# Patient Record
Sex: Male | Born: 1980 | Hispanic: Yes | Marital: Married | Smoking: Never smoker
Health system: Southern US, Community
[De-identification: ages and names within clinical notes are randomized; demographics above are authoritative.]

## PROBLEM LIST (undated history)

## (undated) DIAGNOSIS — Z8547 Personal history of malignant neoplasm of testis: Secondary | ICD-10-CM

## (undated) DIAGNOSIS — Z9221 Personal history of antineoplastic chemotherapy: Secondary | ICD-10-CM

## (undated) DIAGNOSIS — B001 Herpesviral vesicular dermatitis: Secondary | ICD-10-CM

## (undated) DIAGNOSIS — D509 Iron deficiency anemia, unspecified: Secondary | ICD-10-CM

## (undated) DIAGNOSIS — S82891A Other fracture of right lower leg, initial encounter for closed fracture: Secondary | ICD-10-CM

## (undated) HISTORY — PX: CYSTOSCOPY: SUR368

## (undated) HISTORY — DX: Iron deficiency anemia, unspecified: D50.9

---

## 2006-01-08 ENCOUNTER — Emergency Department (HOSPITAL_COMMUNITY): Admission: EM | Admit: 2006-01-08 | Discharge: 2006-01-08 | Payer: Self-pay | Admitting: Emergency Medicine

## 2009-11-13 DIAGNOSIS — Z8547 Personal history of malignant neoplasm of testis: Secondary | ICD-10-CM

## 2009-11-13 DIAGNOSIS — Z9221 Personal history of antineoplastic chemotherapy: Secondary | ICD-10-CM

## 2009-11-13 HISTORY — DX: Personal history of antineoplastic chemotherapy: Z92.21

## 2009-11-13 HISTORY — DX: Personal history of malignant neoplasm of testis: Z85.47

## 2010-03-14 ENCOUNTER — Ambulatory Visit: Payer: Self-pay | Admitting: Oncology

## 2010-03-15 ENCOUNTER — Inpatient Hospital Stay (HOSPITAL_COMMUNITY): Admission: EM | Admit: 2010-03-15 | Discharge: 2010-03-18 | Payer: Self-pay | Admitting: Emergency Medicine

## 2010-03-16 ENCOUNTER — Encounter: Payer: Self-pay | Admitting: Internal Medicine

## 2010-03-16 ENCOUNTER — Encounter (INDEPENDENT_AMBULATORY_CARE_PROVIDER_SITE_OTHER): Payer: Self-pay | Admitting: Internal Medicine

## 2010-03-16 DIAGNOSIS — C6290 Malignant neoplasm of unspecified testis, unspecified whether descended or undescended: Secondary | ICD-10-CM

## 2010-03-16 DIAGNOSIS — R1900 Intra-abdominal and pelvic swelling, mass and lump, unspecified site: Secondary | ICD-10-CM

## 2010-03-16 HISTORY — PX: ORCHIECTOMY: SHX2116

## 2010-03-16 HISTORY — PX: CYSTOSCOPY W/ URETERAL STENT PLACEMENT: SHX1429

## 2010-03-17 LAB — CONVERTED CEMR LAB
HCT: 37.3 %
Hemoglobin: 12.2 g/dL
MCHC: 32.7 g/dL
MCV: 76.3 fL
Platelets: 261 10*3/uL
RBC: 4.9 M/uL
RDW: 17.3 %
WBC: 10 10*3/uL

## 2010-03-18 ENCOUNTER — Ambulatory Visit: Payer: Self-pay | Admitting: Oncology

## 2010-03-18 LAB — CONVERTED CEMR LAB
ALT: 12 units/L
AST: 16 units/L
Albumin: 2.9 g/dL
Alkaline Phosphatase: 60 units/L
BUN: 5 mg/dL
CO2: 28 meq/L
Calcium: 8.4 mg/dL
Chloride: 104 meq/L
Creatinine, Ser: 0.99 mg/dL
Glucose, Bld: 108 mg/dL
Potassium: 3.8 meq/L
Sodium: 136 meq/L
Total Bilirubin: 0.7 mg/dL
Total Protein: 6.7 g/dL

## 2010-03-23 LAB — BASIC METABOLIC PANEL
BUN: 11 mg/dL (ref 6–23)
CO2: 19 mEq/L (ref 19–32)
Chloride: 101 mEq/L (ref 96–112)
Potassium: 4.3 mEq/L (ref 3.5–5.3)

## 2010-04-05 DIAGNOSIS — C6290 Malignant neoplasm of unspecified testis, unspecified whether descended or undescended: Secondary | ICD-10-CM | POA: Insufficient documentation

## 2010-04-05 LAB — CBC WITH DIFFERENTIAL/PLATELET
Basophils Absolute: 0 10*3/uL (ref 0.0–0.1)
Eosinophils Absolute: 0 10*3/uL (ref 0.0–0.5)
HGB: 13.4 g/dL (ref 13.0–17.1)
MCV: 71.6 fL — ABNORMAL LOW (ref 79.3–98.0)
MONO#: 0.1 10*3/uL (ref 0.1–0.9)
MONO%: 3.6 % (ref 0.0–14.0)
NEUT#: 1.4 10*3/uL — ABNORMAL LOW (ref 1.5–6.5)
Platelets: 159 10*3/uL (ref 140–400)
RDW: 16 % — ABNORMAL HIGH (ref 11.0–14.6)
WBC: 2.7 10*3/uL — ABNORMAL LOW (ref 4.0–10.3)
nRBC: 0 % (ref 0–0)

## 2010-04-05 LAB — COMPREHENSIVE METABOLIC PANEL
Albumin: 3.3 g/dL — ABNORMAL LOW (ref 3.5–5.2)
Alkaline Phosphatase: 86 U/L (ref 39–117)
BUN: 18 mg/dL (ref 6–23)
Calcium: 8.2 mg/dL — ABNORMAL LOW (ref 8.4–10.5)
Chloride: 102 mEq/L (ref 96–112)
Creatinine, Ser: 0.76 mg/dL (ref 0.40–1.50)
Glucose, Bld: 98 mg/dL (ref 70–99)
Potassium: 3.3 mEq/L — ABNORMAL LOW (ref 3.5–5.3)

## 2010-04-06 ENCOUNTER — Ambulatory Visit: Payer: Self-pay | Admitting: Nurse Practitioner

## 2010-04-12 LAB — CBC WITH DIFFERENTIAL/PLATELET
BASO%: 0.3 % (ref 0.0–2.0)
Basophils Absolute: 0 10*3/uL (ref 0.0–0.1)
Eosinophils Absolute: 0 10*3/uL (ref 0.0–0.5)
HCT: 36.3 % — ABNORMAL LOW (ref 38.4–49.9)
HGB: 11.6 g/dL — ABNORMAL LOW (ref 13.0–17.1)
LYMPH%: 26.5 % (ref 14.0–49.0)
MCHC: 32 g/dL (ref 32.0–36.0)
MONO#: 1.2 10*3/uL — ABNORMAL HIGH (ref 0.1–0.9)
NEUT%: 57.6 % (ref 39.0–75.0)
Platelets: 168 10*3/uL (ref 140–400)
WBC: 7.7 10*3/uL (ref 4.0–10.3)
lymph#: 2 10*3/uL (ref 0.9–3.3)

## 2010-04-14 LAB — AFP TUMOR MARKER: AFP-Tumor Marker: 303.8 ng/mL — ABNORMAL HIGH (ref 0.0–8.0)

## 2010-04-18 ENCOUNTER — Ambulatory Visit: Payer: Self-pay | Admitting: Oncology

## 2010-04-18 LAB — CBC WITH DIFFERENTIAL/PLATELET
Basophils Absolute: 0 10*3/uL (ref 0.0–0.1)
Eosinophils Absolute: 0 10*3/uL (ref 0.0–0.5)
HCT: 36.8 % — ABNORMAL LOW (ref 38.4–49.9)
HGB: 11.6 g/dL — ABNORMAL LOW (ref 13.0–17.1)
LYMPH%: 16.8 % (ref 14.0–49.0)
MCHC: 31.5 g/dL — ABNORMAL LOW (ref 32.0–36.0)
MONO#: 0.8 10*3/uL (ref 0.1–0.9)
NEUT%: 74.4 % (ref 39.0–75.0)
Platelets: 374 10*3/uL (ref 140–400)
WBC: 9.2 10*3/uL (ref 4.0–10.3)
lymph#: 1.5 10*3/uL (ref 0.9–3.3)

## 2010-04-18 LAB — COMPREHENSIVE METABOLIC PANEL
ALT: 20 U/L (ref 0–53)
BUN: 14 mg/dL (ref 6–23)
CO2: 27 mEq/L (ref 19–32)
Calcium: 8.7 mg/dL (ref 8.4–10.5)
Chloride: 106 mEq/L (ref 96–112)
Creatinine, Ser: 0.78 mg/dL (ref 0.40–1.50)
Glucose, Bld: 110 mg/dL — ABNORMAL HIGH (ref 70–99)
Total Bilirubin: 0.5 mg/dL (ref 0.3–1.2)

## 2010-04-18 LAB — URINALYSIS, MICROSCOPIC - CHCC

## 2010-04-18 LAB — MAGNESIUM: Magnesium: 2.2 mg/dL (ref 1.5–2.5)

## 2010-04-20 LAB — URINE CULTURE

## 2010-04-25 ENCOUNTER — Ambulatory Visit: Admission: RE | Admit: 2010-04-25 | Discharge: 2010-04-25 | Payer: Self-pay | Admitting: Oncology

## 2010-05-03 LAB — BASIC METABOLIC PANEL
CO2: 26 mEq/L (ref 19–32)
Calcium: 8.6 mg/dL (ref 8.4–10.5)
Creatinine, Ser: 0.85 mg/dL (ref 0.40–1.50)
Sodium: 138 mEq/L (ref 135–145)

## 2010-05-03 LAB — CBC WITH DIFFERENTIAL/PLATELET
Basophils Absolute: 0.1 10*3/uL (ref 0.0–0.1)
EOS%: 1.4 % (ref 0.0–7.0)
Eosinophils Absolute: 0.1 10*3/uL (ref 0.0–0.5)
HCT: 31.4 % — ABNORMAL LOW (ref 38.4–49.9)
HGB: 10.1 g/dL — ABNORMAL LOW (ref 13.0–17.1)
MCH: 24.6 pg — ABNORMAL LOW (ref 27.2–33.4)
MCV: 76.4 fL — ABNORMAL LOW (ref 79.3–98.0)
NEUT#: 5.1 10*3/uL (ref 1.5–6.5)
NEUT%: 71.2 % (ref 39.0–75.0)
RDW: 27.2 % — ABNORMAL HIGH (ref 11.0–14.6)
lymph#: 1.5 10*3/uL (ref 0.9–3.3)

## 2010-05-03 LAB — URINALYSIS, MICROSCOPIC - CHCC
Bilirubin (Urine): NEGATIVE
Blood: NEGATIVE
Ketones: NEGATIVE mg/dL
pH: 5 (ref 4.6–8.0)

## 2010-05-03 LAB — MAGNESIUM: Magnesium: 2.3 mg/dL (ref 1.5–2.5)

## 2010-05-05 LAB — AFP TUMOR MARKER: AFP-Tumor Marker: 55.4 ng/mL — ABNORMAL HIGH (ref 0.0–8.0)

## 2010-05-05 LAB — FERRITIN: Ferritin: 314 ng/mL (ref 22–322)

## 2010-05-05 LAB — BETA HCG QUANT (REF LAB): Beta hCG, Tumor Marker: 330.1 m[IU]/mL — ABNORMAL HIGH (ref <5.0)

## 2010-05-10 LAB — CBC WITH DIFFERENTIAL/PLATELET
Basophils Absolute: 0 10*3/uL (ref 0.0–0.1)
EOS%: 0.1 % (ref 0.0–7.0)
HCT: 31.3 % — ABNORMAL LOW (ref 38.4–49.9)
HGB: 10.4 g/dL — ABNORMAL LOW (ref 13.0–17.1)
MCH: 25.6 pg — ABNORMAL LOW (ref 27.2–33.4)
MONO#: 0.9 10*3/uL (ref 0.1–0.9)
NEUT#: 11.3 10*3/uL — ABNORMAL HIGH (ref 1.5–6.5)
NEUT%: 88 % — ABNORMAL HIGH (ref 39.0–75.0)
RDW: 28.9 % — ABNORMAL HIGH (ref 11.0–14.6)
WBC: 12.8 10*3/uL — ABNORMAL HIGH (ref 4.0–10.3)
lymph#: 0.6 10*3/uL — ABNORMAL LOW (ref 0.9–3.3)

## 2010-05-14 ENCOUNTER — Emergency Department (HOSPITAL_COMMUNITY): Admission: EM | Admit: 2010-05-14 | Discharge: 2010-05-14 | Payer: Self-pay | Admitting: Emergency Medicine

## 2010-05-17 LAB — CBC WITH DIFFERENTIAL/PLATELET
BASO%: 0.3 % (ref 0.0–2.0)
EOS%: 0.3 % (ref 0.0–7.0)
HCT: 29.8 % — ABNORMAL LOW (ref 38.4–49.9)
LYMPH%: 16 % (ref 14.0–49.0)
MCH: 24.8 pg — ABNORMAL LOW (ref 27.2–33.4)
MCHC: 32.9 g/dL (ref 32.0–36.0)
MCV: 75.4 fL — ABNORMAL LOW (ref 79.3–98.0)
MONO%: 4.4 % (ref 0.0–14.0)
NEUT%: 79 % — ABNORMAL HIGH (ref 39.0–75.0)
Platelets: 202 10*3/uL (ref 140–400)
lymph#: 1.2 10*3/uL (ref 0.9–3.3)

## 2010-05-20 ENCOUNTER — Ambulatory Visit: Payer: Self-pay | Admitting: Oncology

## 2010-05-24 LAB — CBC WITH DIFFERENTIAL/PLATELET
BASO%: 0.5 % (ref 0.0–2.0)
LYMPH%: 23 % (ref 14.0–49.0)
MCH: 25.5 pg — ABNORMAL LOW (ref 27.2–33.4)
MCHC: 32.5 g/dL (ref 32.0–36.0)
MCV: 78.5 fL — ABNORMAL LOW (ref 79.3–98.0)
MONO%: 13.2 % (ref 0.0–14.0)
Platelets: 95 10*3/uL — ABNORMAL LOW (ref 140–400)
RBC: 4.04 10*6/uL — ABNORMAL LOW (ref 4.20–5.82)
nRBC: 1 % — ABNORMAL HIGH (ref 0–0)

## 2010-05-26 ENCOUNTER — Ambulatory Visit (HOSPITAL_COMMUNITY): Admission: RE | Admit: 2010-05-26 | Discharge: 2010-05-26 | Payer: Self-pay | Admitting: Oncology

## 2010-05-27 LAB — BASIC METABOLIC PANEL
BUN: 9 mg/dL (ref 6–23)
Calcium: 8.8 mg/dL (ref 8.4–10.5)
Creatinine, Ser: 0.83 mg/dL (ref 0.40–1.50)

## 2010-05-27 LAB — AFP TUMOR MARKER: AFP-Tumor Marker: 20.1 ng/mL — ABNORMAL HIGH (ref 0.0–8.0)

## 2010-05-27 LAB — MAGNESIUM: Magnesium: 2.2 mg/dL (ref 1.5–2.5)

## 2010-05-30 LAB — CBC WITH DIFFERENTIAL/PLATELET
BASO%: 0.2 % (ref 0.0–2.0)
EOS%: 0.9 % (ref 0.0–7.0)
MCH: 26.3 pg — ABNORMAL LOW (ref 27.2–33.4)
MCHC: 32.5 g/dL (ref 32.0–36.0)
MCV: 81 fL (ref 79.3–98.0)
MONO%: 7.7 % (ref 0.0–14.0)
NEUT%: 76.7 % — ABNORMAL HIGH (ref 39.0–75.0)
RDW: 27.6 % — ABNORMAL HIGH (ref 11.0–14.6)
lymph#: 1.2 10*3/uL (ref 0.9–3.3)

## 2010-06-01 LAB — BETA HCG QUANT (REF LAB): Beta hCG, Tumor Marker: 72.7 m[IU]/mL — ABNORMAL HIGH (ref <5.0)

## 2010-06-01 LAB — COMPREHENSIVE METABOLIC PANEL
AST: 16 U/L (ref 0–37)
Albumin: 4.2 g/dL (ref 3.5–5.2)
Alkaline Phosphatase: 87 U/L (ref 39–117)
BUN: 13 mg/dL (ref 6–23)
Potassium: 4.1 mEq/L (ref 3.5–5.3)
Sodium: 138 mEq/L (ref 135–145)
Total Bilirubin: 0.3 mg/dL (ref 0.3–1.2)

## 2010-06-14 LAB — CBC WITH DIFFERENTIAL/PLATELET
BASO%: 0.7 % (ref 0.0–2.0)
Basophils Absolute: 0 10*3/uL (ref 0.0–0.1)
EOS%: 1.2 % (ref 0.0–7.0)
MCH: 28.3 pg (ref 27.2–33.4)
MCHC: 33.5 g/dL (ref 32.0–36.0)
MCV: 84.4 fL (ref 79.3–98.0)
MONO%: 2.5 % (ref 0.0–14.0)
RBC: 3.25 10*6/uL — ABNORMAL LOW (ref 4.20–5.82)
RDW: 29.8 % — ABNORMAL HIGH (ref 11.0–14.6)
lymph#: 1 10*3/uL (ref 0.9–3.3)

## 2010-06-14 LAB — BASIC METABOLIC PANEL
BUN: 9 mg/dL (ref 6–23)
Chloride: 107 mEq/L (ref 96–112)
Potassium: 3.8 mEq/L (ref 3.5–5.3)
Sodium: 139 mEq/L (ref 135–145)

## 2010-06-14 LAB — MAGNESIUM: Magnesium: 2 mg/dL (ref 1.5–2.5)

## 2010-06-29 ENCOUNTER — Ambulatory Visit: Payer: Self-pay | Admitting: Oncology

## 2010-06-30 LAB — CBC WITH DIFFERENTIAL/PLATELET
BASO%: 0.5 % (ref 0.0–2.0)
Basophils Absolute: 0 10*3/uL (ref 0.0–0.1)
HCT: 31.5 % — ABNORMAL LOW (ref 38.4–49.9)
HGB: 10.5 g/dL — ABNORMAL LOW (ref 13.0–17.1)
MONO#: 0.7 10*3/uL (ref 0.1–0.9)
NEUT%: 81.6 % — ABNORMAL HIGH (ref 39.0–75.0)
WBC: 7.6 10*3/uL (ref 4.0–10.3)
lymph#: 0.7 10*3/uL — ABNORMAL LOW (ref 0.9–3.3)

## 2010-06-30 LAB — COMPREHENSIVE METABOLIC PANEL
ALT: 21 U/L (ref 0–53)
Albumin: 3.8 g/dL (ref 3.5–5.2)
BUN: 12 mg/dL (ref 6–23)
CO2: 29 mEq/L (ref 19–32)
Calcium: 8.8 mg/dL (ref 8.4–10.5)
Chloride: 106 mEq/L (ref 96–112)
Creatinine, Ser: 0.92 mg/dL (ref 0.40–1.50)

## 2010-06-30 LAB — LACTATE DEHYDROGENASE: LDH: 176 U/L (ref 94–250)

## 2010-07-03 LAB — BETA HCG QUANT (REF LAB): Beta hCG, Tumor Marker: 19.3 m[IU]/mL — ABNORMAL HIGH (ref <5.0)

## 2010-07-21 ENCOUNTER — Ambulatory Visit (HOSPITAL_COMMUNITY): Admission: RE | Admit: 2010-07-21 | Discharge: 2010-07-21 | Payer: Self-pay | Admitting: Oncology

## 2010-07-25 LAB — AFP TUMOR MARKER: AFP-Tumor Marker: 4.9 ng/mL (ref 0.0–8.0)

## 2010-07-25 LAB — BETA HCG QUANT (REF LAB): Beta hCG, Tumor Marker: 9 m[IU]/mL — ABNORMAL HIGH (ref <5.0)

## 2010-07-25 LAB — LACTATE DEHYDROGENASE: LDH: 209 U/L (ref 94–250)

## 2010-08-19 ENCOUNTER — Ambulatory Visit: Payer: Self-pay | Admitting: Oncology

## 2010-08-20 ENCOUNTER — Emergency Department (HOSPITAL_COMMUNITY): Admission: EM | Admit: 2010-08-20 | Discharge: 2010-08-20 | Payer: Self-pay | Admitting: Emergency Medicine

## 2010-08-25 LAB — AFP TUMOR MARKER: AFP-Tumor Marker: 3.5 ng/mL (ref 0.0–8.0)

## 2010-08-25 LAB — BETA HCG QUANT (REF LAB): Beta hCG, Tumor Marker: 4.9 m[IU]/mL (ref <5.0)

## 2010-08-25 LAB — LACTATE DEHYDROGENASE: LDH: 177 U/L (ref 94–250)

## 2010-09-09 ENCOUNTER — Ambulatory Visit (HOSPITAL_COMMUNITY): Admission: RE | Admit: 2010-09-09 | Discharge: 2010-09-09 | Payer: Self-pay | Admitting: Oncology

## 2010-09-09 LAB — COMPREHENSIVE METABOLIC PANEL
ALT: 45 U/L (ref 0–53)
AST: 33 U/L (ref 0–37)
Albumin: 4 g/dL (ref 3.5–5.2)
Alkaline Phosphatase: 90 U/L (ref 39–117)
BUN: 13 mg/dL (ref 6–23)
CO2: 27 mEq/L (ref 19–32)
Calcium: 8.9 mg/dL (ref 8.4–10.5)
Chloride: 109 mEq/L (ref 96–112)
Creatinine, Ser: 0.93 mg/dL (ref 0.40–1.50)
Glucose, Bld: 89 mg/dL (ref 70–99)
Potassium: 3.8 mEq/L (ref 3.5–5.3)
Sodium: 140 mEq/L (ref 135–145)
Total Bilirubin: 0.7 mg/dL (ref 0.3–1.2)
Total Protein: 7.4 g/dL (ref 6.0–8.3)

## 2010-09-09 LAB — LACTATE DEHYDROGENASE: LDH: 183 U/L (ref 94–250)

## 2010-09-12 LAB — AFP TUMOR MARKER: AFP-Tumor Marker: 3.6 ng/mL (ref 0.0–8.0)

## 2010-09-12 LAB — BETA HCG QUANT (REF LAB): Beta hCG, Tumor Marker: 4.1 m[IU]/mL (ref <5.0)

## 2010-11-25 ENCOUNTER — Ambulatory Visit: Payer: Self-pay | Admitting: Oncology

## 2010-11-28 ENCOUNTER — Emergency Department (HOSPITAL_COMMUNITY)
Admission: EM | Admit: 2010-11-28 | Discharge: 2010-11-28 | Payer: Self-pay | Source: Home / Self Care | Admitting: Emergency Medicine

## 2010-11-30 LAB — CBC
HCT: 34.3 % — ABNORMAL LOW (ref 39.0–52.0)
Hemoglobin: 11.1 g/dL — ABNORMAL LOW (ref 13.0–17.0)
MCH: 28.2 pg (ref 26.0–34.0)
MCHC: 32.4 g/dL (ref 30.0–36.0)
MCV: 87.3 fL (ref 78.0–100.0)
Platelets: 280 10*3/uL (ref 150–400)
RBC: 3.93 MIL/uL — ABNORMAL LOW (ref 4.22–5.81)
RDW: 16.7 % — ABNORMAL HIGH (ref 11.5–15.5)
WBC: 6.7 10*3/uL (ref 4.0–10.5)

## 2010-11-30 LAB — DIFFERENTIAL
Basophils Absolute: 0 10*3/uL (ref 0.0–0.1)
Basophils Relative: 0 % (ref 0–1)
Eosinophils Absolute: 0.1 10*3/uL (ref 0.0–0.7)
Eosinophils Relative: 1 % (ref 0–5)
Lymphocytes Relative: 10 % — ABNORMAL LOW (ref 12–46)
Lymphs Abs: 0.7 10*3/uL (ref 0.7–4.0)
Monocytes Absolute: 0.5 10*3/uL (ref 0.1–1.0)
Monocytes Relative: 7 % (ref 3–12)
Neutro Abs: 5.5 10*3/uL (ref 1.7–7.7)
Neutrophils Relative %: 82 % — ABNORMAL HIGH (ref 43–77)

## 2010-12-13 NOTE — Assessment & Plan Note (Signed)
Summary: NEW - Establish Care   Vital Signs:  Patient profile:   30 year old male Height:      68 inches Weight:      193.8 pounds BMI:     29.57 BSA:     2.02 Pulse rate:   83 / minute Pulse rhythm:   regular Resp:     20 per minute BP sitting:   103 / 66  (left arm) Cuff size:   large  Vitals Entered By: Levon Hedger (Apr 06, 2010 11:06 AM) CC: follow-up visit [facility] Is Patient Diabetic? No Pain Assessment Patient in pain? no       Does patient need assistance? Functional Status Self care Ambulation Normal   CC:  follow-up visit [facility].  History of Present Illness:  Pt into the office to establish care. No previous PCP  Presents today with wife who is pregnant with couples 3rd child and is due on June 3rd, 2011  PMH - none PSH - none  Hospitalization from 03/16/2010 to 03/18/2010 (full hospital d/c reviewed) pt presented to the ER with back pain and was dx with testicular cancer  1.  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 (Dr. Truett Perna)  2.  Abdominal mass - noted while in the hospital Urology consulted Cystoscopy - right retrograde pyelogram with interpretion, right double J stent placement Pt has a f/u appt with Dr. Patsi Sears on June 1st  Social - Employed at [employer] and has currently taken a leave a absence due to recent illness and ongoing treatment  Habits & Providers  Alcohol-Tobacco-Diet     Alcohol drinks/day: <1     Tobacco Status: never  Exercise-Depression-Behavior     Have you felt down or hopeless? no     Have you felt little pleasure in things? no     Depression Counseling: not indicated; screening negative for depression     Drug Use: never  Allergies (verified): No Known Drug Allergies  Past History:  Past  Surgical History: right radical inguinal orchiectomy 03/2010 cystoscopy, right retrograde pyelogram with interpretation, right double- J stent placement 03/2010  Family History: father - diabetes sister- diabetes brother - diabetes  Social History: married 3 children tobacco - none ETOH - none drug - noneSmoking Status:  never Drug Use:  never  Review of Systems General:  Denies fever. CV:  Denies chest pain or discomfort. Resp:  Denies cough. GI:  Complains of abdominal pain and nausea; denies constipation and vomiting.  Physical Exam  General:  alert.   Head:  normocephalic.   Lungs:  normal breath sounds.   Heart:  normal rate and regular rhythm.   Abdomen:  right lower quad tenderness with deep palpation Msk:  normal ROM.   Neurologic:  alert & oriented X3.     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 One capsule by  mouth daily 2)  Uribel 118 Mg Caps (Meth-hyo-m bl-na phos-ph sal) .... One capsule by mouth three times a day 3)  Tylenol 325 Mg Tabs (Acetaminophen) .... 2 tablets by mouth every 4 hours as needed 4)  Percocet 5-325 Mg Tabs (Oxycodone-acetaminophen) .... One tblet by mouth three times a day as needed for pain  Patient Instructions: 1)  Schedule an eligibility appointment to get an orange card. 2)  Please let the cancer center know that you are coming to this office so they can sent the notes here 3)  Follow up as needed   X-ray  Procedure date:   03/18/2010  Findings:      abdominal x-ray - no demonstrated acute abdominal findings. bowel gass pattern is normal.  mild perihilar right lung atelectsis  CT of Abdomen  Procedure date:  03/18/2010  Findings:      large hypervascular right retroperitoneal mass which compresses the IVC and causes obstruction of the proximal right ureter and moderate hydronephrosis.    MISC. Report  Procedure date:  03/16/2010  Findings:      ultrasound of scrotum - 3.5cm right testicular mass compatible with testicular neoplasm. Left testicular microlithiasis  CT of Chest  Procedure date:  03/16/2010  Findings:      scattered tiny bilateral pulmonary parenchymal nodules. imaging features are nonspecific and while this may be related to noncalcified granulomatous scarring, pulmonary metastatic involvement certainly has this appearance. A 1 cm subtle lesion in the posterior right liver.  This is not visible on the previous study presumable secondary to slight difference in bolus time. Metastatic involvementof the liver is a concern  CT Brain  Procedure date:  03/16/2010  Findings:      no acute intracranial findings. no evidence of metastatic involvement   X-ray  Procedure date:  03/18/2010  Findings:      abdominal x-ray - no demonstrated acute abdominal findings. bowel gass pattern is normal.  mild perihilar right lung atelectsis  CT of Abdomen  Procedure date:  03/18/2010  Findings:      large hypervascular right retroperitoneal mass which compresses the IVC and causes obstruction of the proximal right ureter and moderate hydronephrosis.    MISC. Report  Procedure date:  03/16/2010  Findings:      ultrasound of scrotum - 3.5cm right testicular mass compatible with testicular neoplasm. Left testicular microlithiasis  CT of Chest  Procedure date:  03/16/2010  Findings:      scattered tiny bilateral pulmonary parenchymal nodules. imaging features are nonspecific and while  this may be related to noncalcified granulomatous scarring, pulmonary metastatic involvement certainly has this appearance. A 1 cm subtle lesion in the posterior right liver.  This is not visible on the previous study presumable secondary to slight difference in bolus time. Metastatic involvementof the liver is a concern  CT Brain  Procedure date:  03/16/2010  Findings:      no acute intracranial findings. no evidence of metastatic involvement

## 2010-12-13 NOTE — Letter (Signed)
Summary: PT INFORMATION SHEET  PT INFORMATION SHEET   Imported By: Arta Bruce 05/23/2010 11:13:12  _____________________________________________________________________  External Attachment:    Type:   Image     Comment:   External Document

## 2010-12-28 ENCOUNTER — Other Ambulatory Visit: Payer: Self-pay | Admitting: Oncology

## 2010-12-28 ENCOUNTER — Encounter (HOSPITAL_BASED_OUTPATIENT_CLINIC_OR_DEPARTMENT_OTHER): Payer: Self-pay | Admitting: Oncology

## 2010-12-28 DIAGNOSIS — C6290 Malignant neoplasm of unspecified testis, unspecified whether descended or undescended: Secondary | ICD-10-CM

## 2010-12-30 LAB — COMPREHENSIVE METABOLIC PANEL
Alkaline Phosphatase: 111 U/L (ref 39–117)
CO2: 24 mEq/L (ref 19–32)
Creatinine, Ser: 0.88 mg/dL (ref 0.40–1.50)
Glucose, Bld: 99 mg/dL (ref 70–99)
Sodium: 139 mEq/L (ref 135–145)
Total Bilirubin: 0.6 mg/dL (ref 0.3–1.2)
Total Protein: 7.5 g/dL (ref 6.0–8.3)

## 2010-12-30 LAB — AFP TUMOR MARKER: AFP-Tumor Marker: 3.5 ng/mL (ref 0.0–8.0)

## 2010-12-30 LAB — BETA HCG QUANT (REF LAB): Beta hCG, Tumor Marker: <0.5 m[IU]/mL (ref <5.0)

## 2011-01-31 LAB — URINALYSIS, ROUTINE W REFLEX MICROSCOPIC
Glucose, UA: NEGATIVE mg/dL
Ketones, ur: NEGATIVE mg/dL
Nitrite: NEGATIVE
Specific Gravity, Urine: 1.018 (ref 1.005–1.030)
pH: 7.5 (ref 5.0–8.0)

## 2011-01-31 LAB — CBC
HCT: 41.9 % (ref 39.0–52.0)
HCT: 37.3 % — ABNORMAL LOW (ref 39.0–52.0)
Hemoglobin: 13.8 g/dL (ref 13.0–17.0)
MCHC: 32.7 g/dL (ref 30.0–36.0)
MCV: 76.3 fL — ABNORMAL LOW (ref 78.0–100.0)
Platelets: 261 10*3/uL (ref 150–400)
RBC: 5.51 MIL/uL (ref 4.22–5.81)
RDW: 16.5 % — ABNORMAL HIGH (ref 11.5–15.5)
RDW: 17.3 % — ABNORMAL HIGH (ref 11.5–15.5)
WBC: 7.9 10*3/uL (ref 4.0–10.5)

## 2011-01-31 LAB — COMPREHENSIVE METABOLIC PANEL
ALT: 14 U/L (ref 0–53)
Albumin: 2.9 g/dL — ABNORMAL LOW (ref 3.5–5.2)
Alkaline Phosphatase: 75 U/L (ref 39–117)
BUN: 13 mg/dL (ref 6–23)
BUN: 5 mg/dL — ABNORMAL LOW (ref 6–23)
Calcium: 8.4 mg/dL (ref 8.4–10.5)
Chloride: 104 mEq/L (ref 96–112)
Chloride: 104 mEq/L (ref 96–112)
Creatinine, Ser: 0.99 mg/dL (ref 0.4–1.5)
Glucose, Bld: 84 mg/dL (ref 70–99)
Potassium: 3.5 mEq/L (ref 3.5–5.1)
Sodium: 134 mEq/L — ABNORMAL LOW (ref 135–145)
Total Bilirubin: 0.8 mg/dL (ref 0.3–1.2)
Total Bilirubin: 0.7 mg/dL (ref 0.3–1.2)
Total Protein: 7 g/dL (ref 6.0–8.3)
Total Protein: 6.7 g/dL (ref 6.0–8.3)

## 2011-01-31 LAB — DIFFERENTIAL
Basophils Absolute: 0 10*3/uL (ref 0.0–0.1)
Lymphocytes Relative: 16 % (ref 12–46)
Lymphs Abs: 1.2 10*3/uL (ref 0.7–4.0)
Neutro Abs: 5.6 10*3/uL (ref 1.7–7.7)

## 2011-01-31 LAB — BASIC METABOLIC PANEL
BUN: 14 mg/dL (ref 6–23)
CO2: 26 mEq/L (ref 19–32)
Chloride: 104 mEq/L (ref 96–112)
Creatinine, Ser: 1.4 mg/dL (ref 0.4–1.5)
Glucose, Bld: 103 mg/dL — ABNORMAL HIGH (ref 70–99)
Potassium: 3.7 mEq/L (ref 3.5–5.1)

## 2011-01-31 LAB — BETA HCG QUANT (REF LAB): Beta hCG, Tumor Marker: 91732 m[IU]/mL — ABNORMAL HIGH (ref <5.0)

## 2011-01-31 LAB — FERRITIN: Ferritin: 111 ng/mL (ref 22–322)

## 2011-01-31 LAB — LIPASE, BLOOD: Lipase: 20 U/L (ref 11–59)

## 2011-01-31 LAB — LACTATE DEHYDROGENASE: LDH: 740 U/L — ABNORMAL HIGH (ref 94–250)

## 2011-02-09 ENCOUNTER — Other Ambulatory Visit: Payer: Self-pay | Admitting: Oncology

## 2011-02-09 ENCOUNTER — Encounter (HOSPITAL_BASED_OUTPATIENT_CLINIC_OR_DEPARTMENT_OTHER): Payer: Self-pay | Admitting: Oncology

## 2011-02-09 DIAGNOSIS — C6290 Malignant neoplasm of unspecified testis, unspecified whether descended or undescended: Secondary | ICD-10-CM

## 2011-02-12 LAB — COMPREHENSIVE METABOLIC PANEL
ALT: 14 U/L (ref 0–53)
Alkaline Phosphatase: 99 U/L (ref 39–117)
CO2: 24 mEq/L (ref 19–32)
Potassium: 3.7 mEq/L (ref 3.5–5.3)
Sodium: 139 mEq/L (ref 135–145)
Total Bilirubin: 0.8 mg/dL (ref 0.3–1.2)
Total Protein: 7 g/dL (ref 6.0–8.3)

## 2011-02-12 LAB — AFP TUMOR MARKER: AFP-Tumor Marker: 1.8 ng/mL (ref 0.0–8.0)

## 2011-05-10 ENCOUNTER — Ambulatory Visit (HOSPITAL_COMMUNITY)
Admission: RE | Admit: 2011-05-10 | Discharge: 2011-05-10 | Disposition: A | Discharge status: Home / Self Care | Payer: Self-pay | Source: Ambulatory Visit | Attending: Oncology | Admitting: Oncology

## 2011-05-10 ENCOUNTER — Other Ambulatory Visit: Payer: Self-pay | Admitting: Oncology

## 2011-05-10 DIAGNOSIS — Z902 Acquired absence of lung [part of]: Secondary | ICD-10-CM | POA: Insufficient documentation

## 2011-05-10 DIAGNOSIS — C6290 Malignant neoplasm of unspecified testis, unspecified whether descended or undescended: Secondary | ICD-10-CM

## 2011-05-11 ENCOUNTER — Other Ambulatory Visit: Payer: Self-pay | Admitting: Oncology

## 2011-05-11 ENCOUNTER — Encounter (HOSPITAL_BASED_OUTPATIENT_CLINIC_OR_DEPARTMENT_OTHER): Payer: Self-pay | Admitting: Oncology

## 2011-05-11 DIAGNOSIS — C6290 Malignant neoplasm of unspecified testis, unspecified whether descended or undescended: Secondary | ICD-10-CM

## 2011-05-11 LAB — CBC WITH DIFFERENTIAL/PLATELET
Basophils Absolute: 0 10*3/uL (ref 0.0–0.1)
EOS%: 1.1 % (ref 0.0–7.0)
HCT: 36.5 % — ABNORMAL LOW (ref 38.4–49.9)
HGB: 11.6 g/dL — ABNORMAL LOW (ref 13.0–17.1)
MCH: 21.3 pg — ABNORMAL LOW (ref 27.2–33.4)
MCV: 67.1 fL — ABNORMAL LOW (ref 79.3–98.0)
MONO%: 9.6 % (ref 0.0–14.0)
NEUT%: 69.1 % (ref 39.0–75.0)

## 2011-05-13 LAB — COMPREHENSIVE METABOLIC PANEL
Alkaline Phosphatase: 108 U/L (ref 39–117)
BUN: 20 mg/dL (ref 6–23)
Glucose, Bld: 139 mg/dL — ABNORMAL HIGH (ref 70–99)
Total Bilirubin: 0.6 mg/dL (ref 0.3–1.2)

## 2011-05-13 LAB — BETA HCG QUANT (REF LAB): Beta hCG, Tumor Marker: 0.5 m[IU]/mL (ref <5.0)

## 2011-05-13 LAB — AFP TUMOR MARKER: AFP-Tumor Marker: 2.4 ng/mL (ref 0.0–8.0)

## 2011-05-23 ENCOUNTER — Other Ambulatory Visit: Payer: Self-pay | Admitting: Oncology

## 2011-05-23 ENCOUNTER — Encounter (HOSPITAL_BASED_OUTPATIENT_CLINIC_OR_DEPARTMENT_OTHER): Payer: Self-pay | Admitting: Oncology

## 2011-05-23 DIAGNOSIS — C6290 Malignant neoplasm of unspecified testis, unspecified whether descended or undescended: Secondary | ICD-10-CM

## 2011-05-23 LAB — URINALYSIS, MICROSCOPIC - CHCC
Blood: NEGATIVE
Leukocyte Esterase: NEGATIVE
Nitrite: NEGATIVE
pH: 6 (ref 4.6–8.0)

## 2011-05-24 ENCOUNTER — Encounter: Payer: Self-pay | Admitting: Gastroenterology

## 2011-05-25 ENCOUNTER — Encounter (HOSPITAL_BASED_OUTPATIENT_CLINIC_OR_DEPARTMENT_OTHER): Payer: Self-pay | Admitting: Oncology

## 2011-06-06 ENCOUNTER — Other Ambulatory Visit: Payer: Self-pay | Admitting: Oncology

## 2011-06-06 DIAGNOSIS — R1909 Other intra-abdominal and pelvic swelling, mass and lump: Secondary | ICD-10-CM

## 2011-07-04 ENCOUNTER — Ambulatory Visit (INDEPENDENT_AMBULATORY_CARE_PROVIDER_SITE_OTHER): Payer: Self-pay | Admitting: Gastroenterology

## 2011-07-04 ENCOUNTER — Encounter: Payer: Self-pay | Admitting: Gastroenterology

## 2011-07-04 VITALS — BP 100/68 | HR 60 | Ht 67.0 in | Wt 212.0 lb

## 2011-07-04 DIAGNOSIS — C6290 Malignant neoplasm of unspecified testis, unspecified whether descended or undescended: Secondary | ICD-10-CM | POA: Insufficient documentation

## 2011-07-04 DIAGNOSIS — D509 Iron deficiency anemia, unspecified: Secondary | ICD-10-CM

## 2011-07-04 DIAGNOSIS — Z809 Family history of malignant neoplasm, unspecified: Secondary | ICD-10-CM

## 2011-07-04 MED ORDER — PEG-KCL-NACL-NASULF-NA ASC-C 100 G PO SOLR: 1.0000 | ORAL | Status: DC

## 2011-07-04 NOTE — Patient Instructions (Signed)
You will be set up for a colonoscopy.  

## 2011-07-04 NOTE — Progress Notes (Signed)
  HPI: This is a  very pleasant 30 year old man. He speaks Spanish predominantly but we can get by with his English is fairly good.  Testicular cancer diagnosed may 2011: treated with orchiectomy, abdominal surgeries ([facility] at [facility]).  No XRT.  +chemo.  Last chemo was November 2011.     Recent blood work shows hemoglobin of 11.6, MCV was in the 60s. Ferritin is quite low. Fecal occult blood testing x3 was negative. He has no overt bleeding.    Father may have had colon cancer (pt unclear).     Review of systems: Pertinent positive and negative review of systems were noted in the above HPI section.  All other review of systems was otherwise negative.   Past Medical History  Diagnosis Date  . Anemia   . Testicular cancer     Past Surgical History  Procedure Date  . Orciectomy     right  . Cystoscopy      reports that he has never smoked. He has never used smokeless tobacco. He reports that he does not drink alcohol or use illicit drugs.  family history includes Diabetes in his brother, father, and sister.    Current Medications, Allergies were all reviewed with the patient via [facility] electronic medical record system.    Physical Exam: BP 100/68  Pulse 60  Ht 5\' 7"  (1.702 m)  Wt 212 lb (96.163 kg)  BMI 33.20 kg/m2 Constitutional: generally well-appearing Psychiatric: alert and oriented x3 Eyes: extraocular movements intact Mouth: oral pharynx moist, no lesions Neck: supple no lymphadenopathy Cardiovascular: heart regular rate and rhythm Lungs: clear to auscultation bilaterally Abdomen: soft, nontender, nondistended, no obvious ascites, no peritoneal signs, normal bowel sounds Extremities: no lower extremity edema bilaterally Skin: no lesions on visible extremities    Assessment and plan: 30 y.o. male with iron def, microcytic anemia, family history of colon cancer  We will arrange for colonosocpy at his soonest convenience for anemia, fh of  colon cancer

## 2011-07-10 ENCOUNTER — Other Ambulatory Visit: Payer: Self-pay | Admitting: Oncology

## 2011-07-10 ENCOUNTER — Encounter (HOSPITAL_BASED_OUTPATIENT_CLINIC_OR_DEPARTMENT_OTHER): Payer: Self-pay | Admitting: Oncology

## 2011-07-10 DIAGNOSIS — C6290 Malignant neoplasm of unspecified testis, unspecified whether descended or undescended: Secondary | ICD-10-CM

## 2011-07-10 LAB — CBC WITH DIFFERENTIAL/PLATELET
Basophils Absolute: 0 10*3/uL (ref 0.0–0.1)
Eosinophils Absolute: 0.1 10*3/uL (ref 0.0–0.5)
HGB: 11.7 g/dL — ABNORMAL LOW (ref 13.0–17.1)
NEUT#: 3.8 10*3/uL (ref 1.5–6.5)
RBC: 5.54 10*6/uL (ref 4.20–5.82)
RDW: 18.6 % — ABNORMAL HIGH (ref 11.0–14.6)
WBC: 5.3 10*3/uL (ref 4.0–10.3)
lymph#: 1.1 10*3/uL (ref 0.9–3.3)

## 2011-07-10 LAB — FERRITIN: Ferritin: 6 ng/mL — ABNORMAL LOW (ref 22–322)

## 2011-07-14 ENCOUNTER — Other Ambulatory Visit: Payer: Self-pay | Admitting: Oncology

## 2011-07-14 DIAGNOSIS — C6290 Malignant neoplasm of unspecified testis, unspecified whether descended or undescended: Secondary | ICD-10-CM

## 2011-07-19 ENCOUNTER — Ambulatory Visit (AMBULATORY_SURGERY_CENTER): Payer: Self-pay | Admitting: Gastroenterology

## 2011-07-19 ENCOUNTER — Other Ambulatory Visit: Payer: Self-pay | Admitting: Gastroenterology

## 2011-07-19 ENCOUNTER — Encounter: Payer: Self-pay | Admitting: Gastroenterology

## 2011-07-19 DIAGNOSIS — D509 Iron deficiency anemia, unspecified: Secondary | ICD-10-CM

## 2011-07-19 DIAGNOSIS — D649 Anemia, unspecified: Secondary | ICD-10-CM

## 2011-07-19 DIAGNOSIS — Z809 Family history of malignant neoplasm, unspecified: Secondary | ICD-10-CM

## 2011-07-19 MED ORDER — SODIUM CHLORIDE 0.9 % IV SOLN: 500.0000 mL | INTRAVENOUS | Status: DC

## 2011-07-19 NOTE — Progress Notes (Signed)
Pt assisted to the rest room with his wife at his side.  No complaints on discharge.  Pt speeks spanish and Vanice Sarah , interpretor from [employer] went over all instructions with the pt and his wife.  No questions noted.

## 2011-07-19 NOTE — Patient Instructions (Signed)
See the picture page for your findings from your exam today.  Follow the green and blue discharge instruction sheets the rest of the day.  Resume your prior medications today. Dr. Christella Hartigan wants you to complete 2 sets of [facility] card at home over the next couple of weeks.  You can either bring them to the 3rd floor or mail them back to [place].  Please call if questions or concerns.

## 2011-07-20 ENCOUNTER — Telehealth: Payer: Self-pay

## 2011-07-20 NOTE — Telephone Encounter (Signed)

## 2011-08-06 IMAGING — CR DG ABDOMEN ACUTE W/ 1V CHEST
3 series · 3 of 3 positions shown · non-contrast
Comparison: None.

CLINICAL DATA: Right mid to low abdominal pain for several weeks.

ACUTE ABDOMEN SERIES (ABDOMEN 2 VIEW & CHEST 1 VIEW)

[w chest pa]
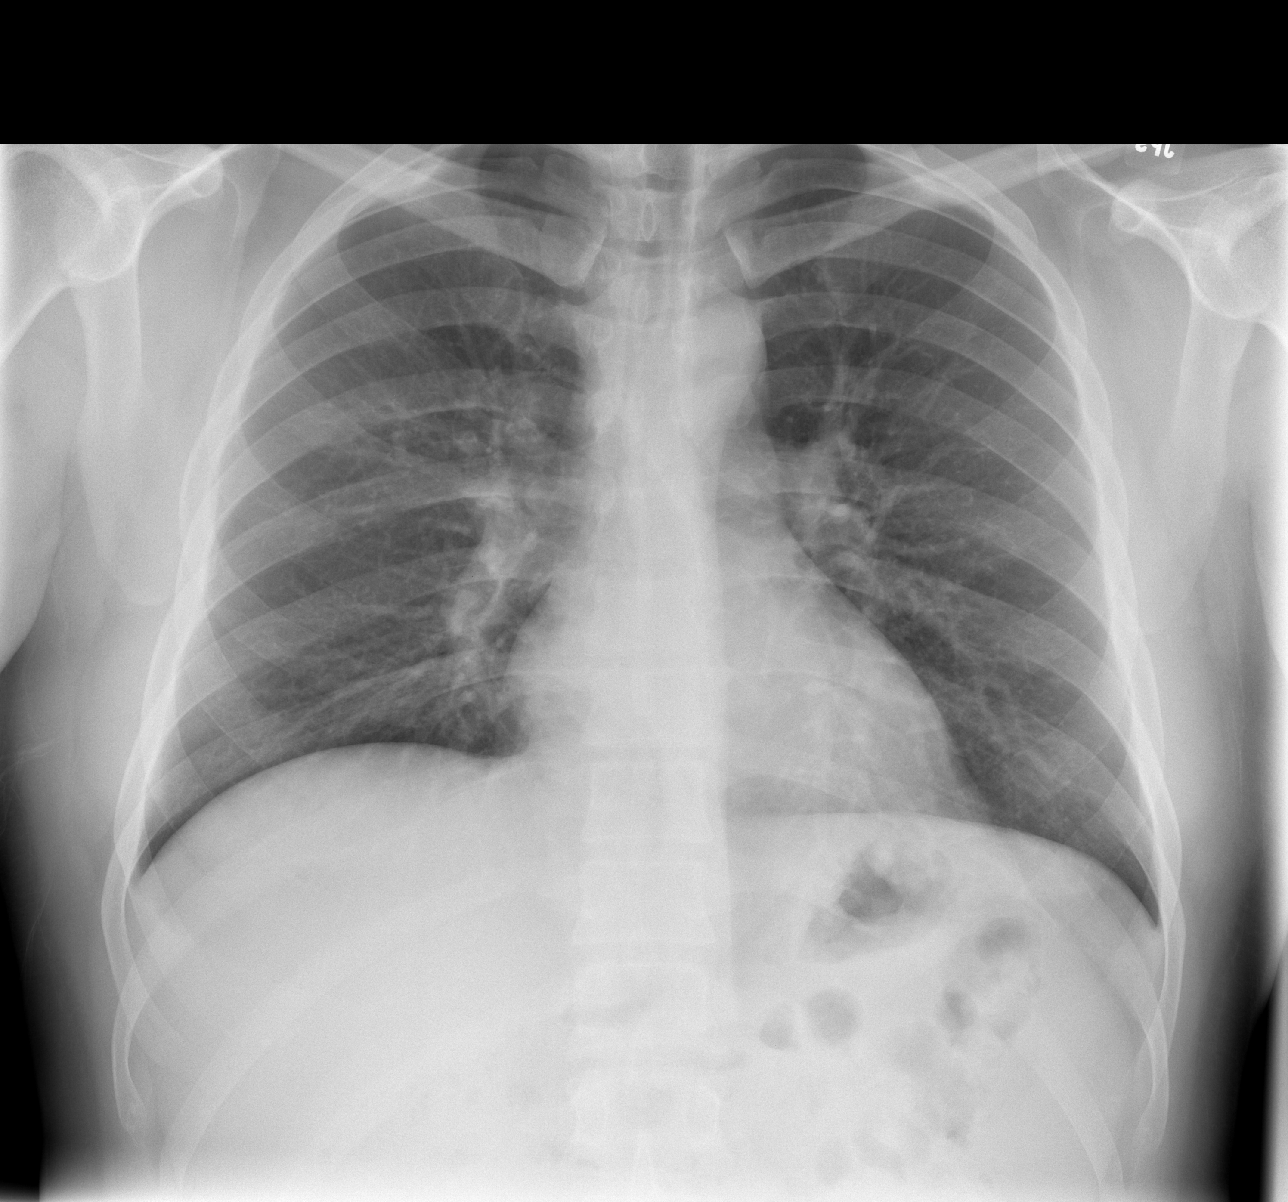

[w abdomen upright]
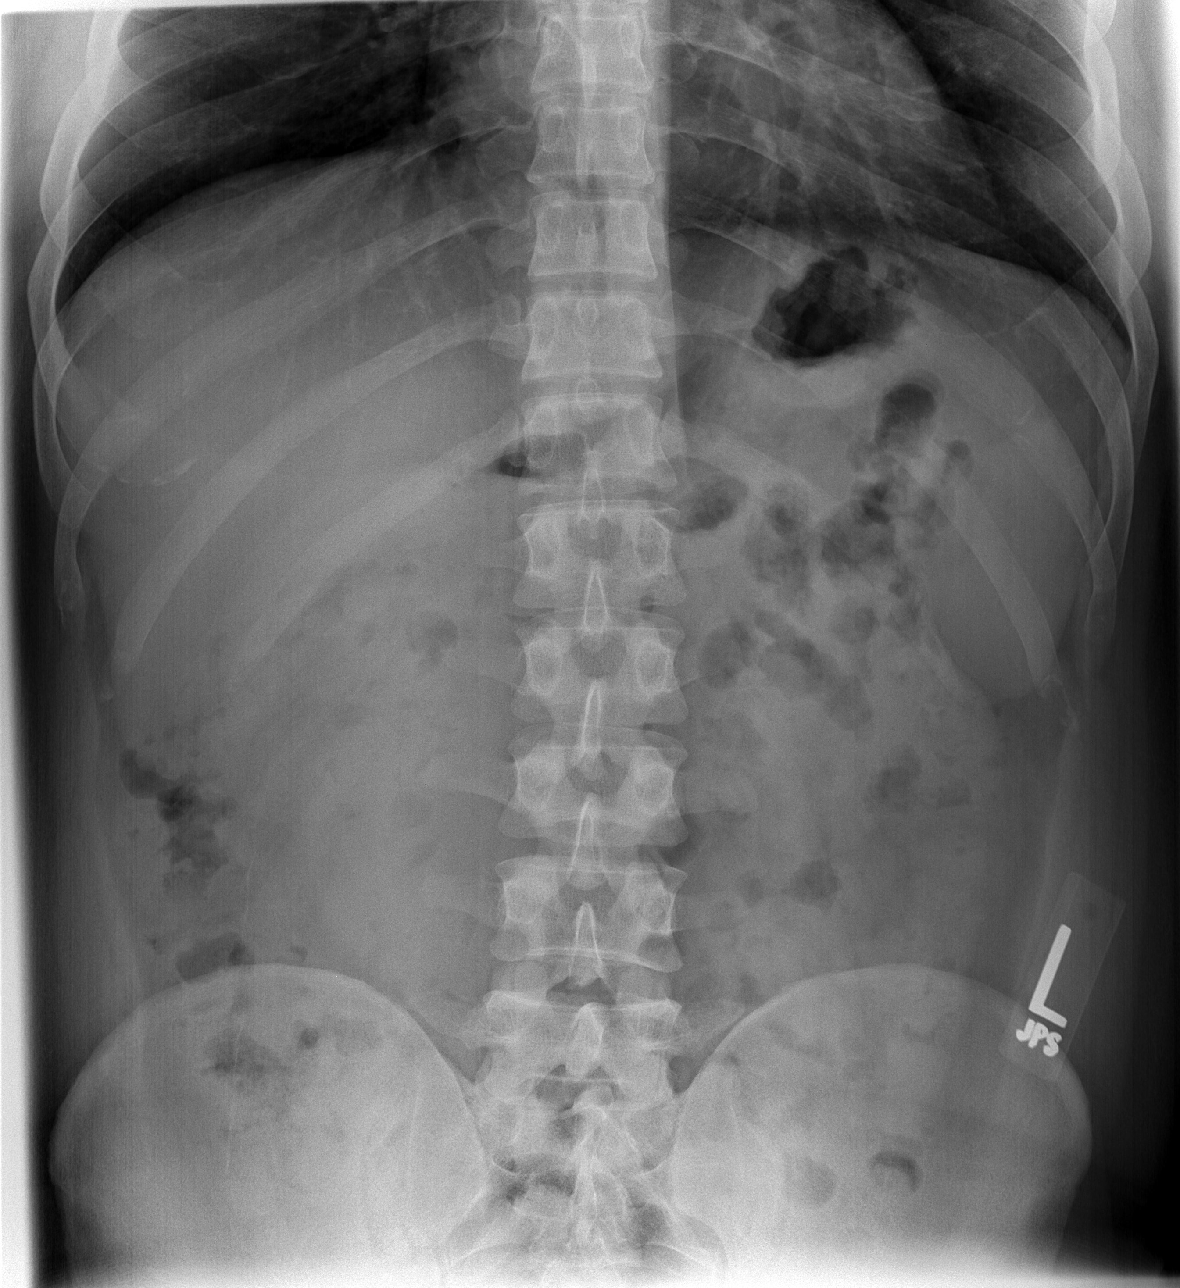

[t abdomen supine]
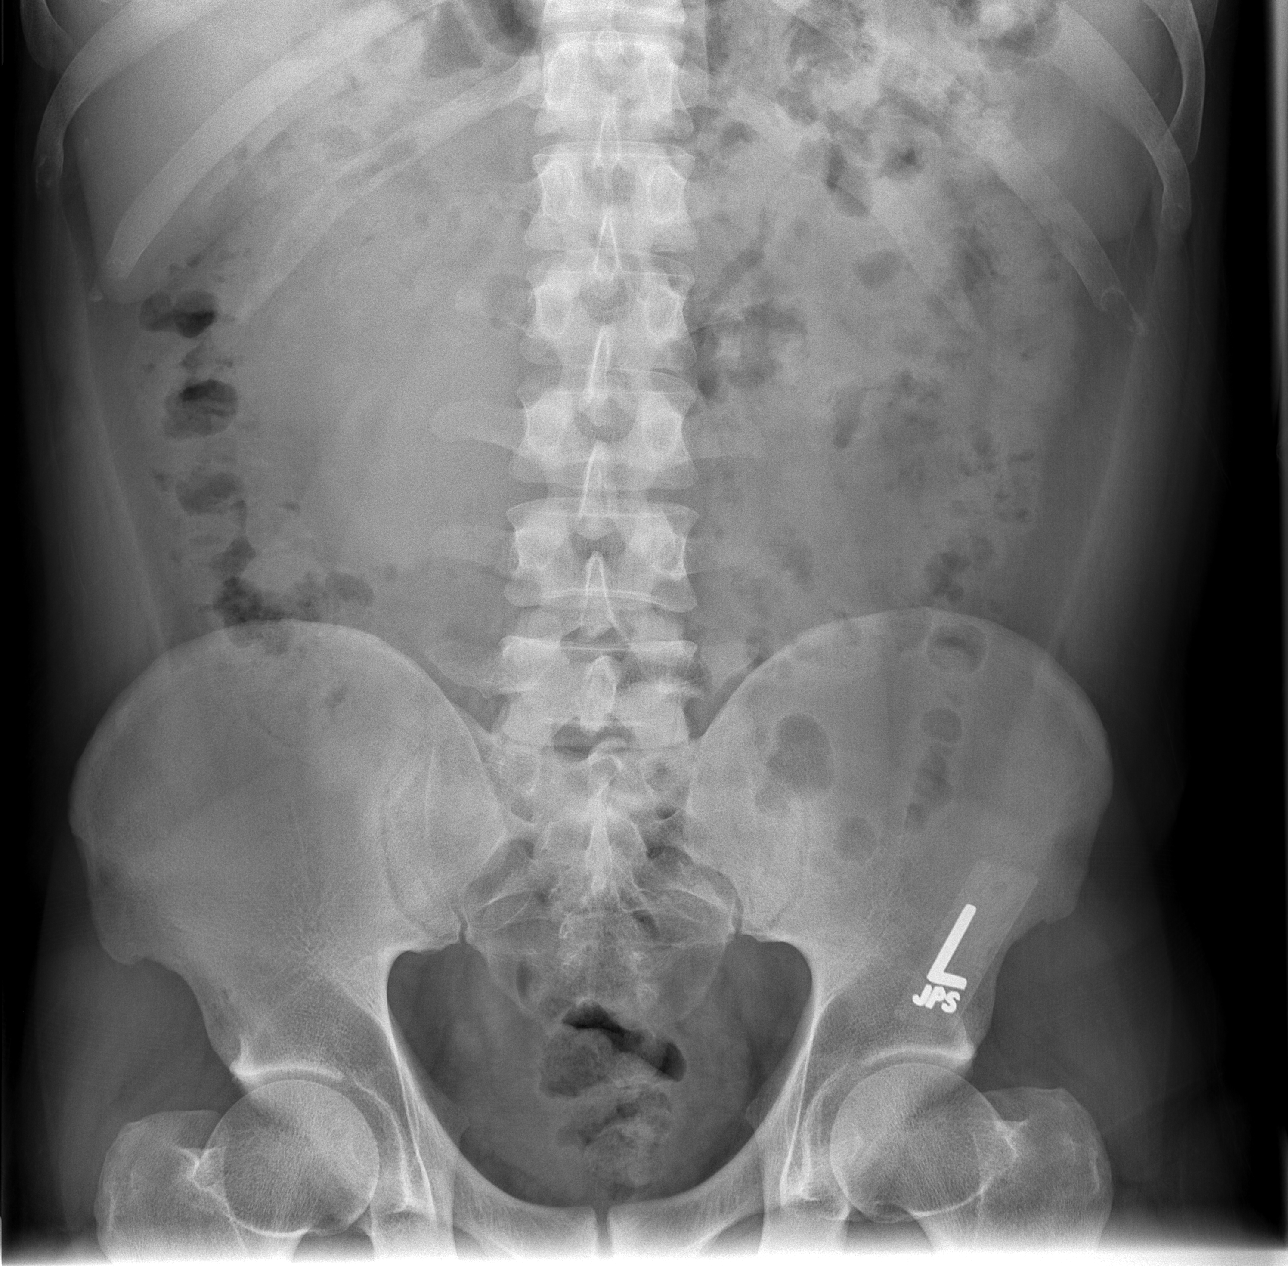

[3 of 3 positions shown; findings below may reference images not displayed]

FINDINGS: Chest radiograph demonstrates low lung volumes with mild
perihilar atelectasis on the right.  The lungs are otherwise clear.
The heart size and mediastinal contours are normal.

The bowel gas pattern is normal.  There is no evidence of free
intraperitoneal air or suspicious abdominal calcification.  The
renal shadows are not well visualized.  No osseous abnormalities
are seen.
IMPRESSION: 1.  No demonstrated acute abdominal findings.  Bowel gas pattern is
normal.
2.  Mild perihilar right lung atelectasis.

## 2011-08-07 IMAGING — US US SCROTUM
1 series · 14 of 25 positions shown · non-contrast
Comparison: CT 03/15/2010

CLINICAL DATA: Right testicular mass.

ULTRASOUND OF SCROTUM
TECHNIQUE: Complete ultrasound examination of the testicles,
epididymis, and other scrotal structures was performed.

[Series 1: us scrotum · 0.08mm/px · 14 of 53 slices shown]
[im 1/53]
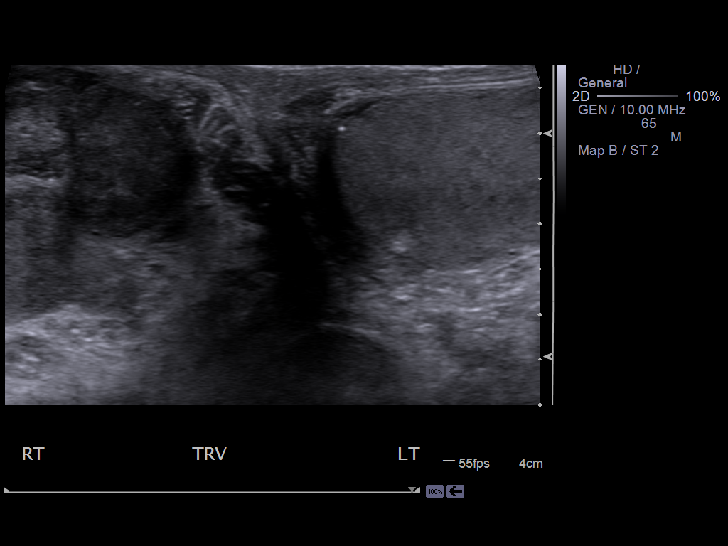
[im 5/53]
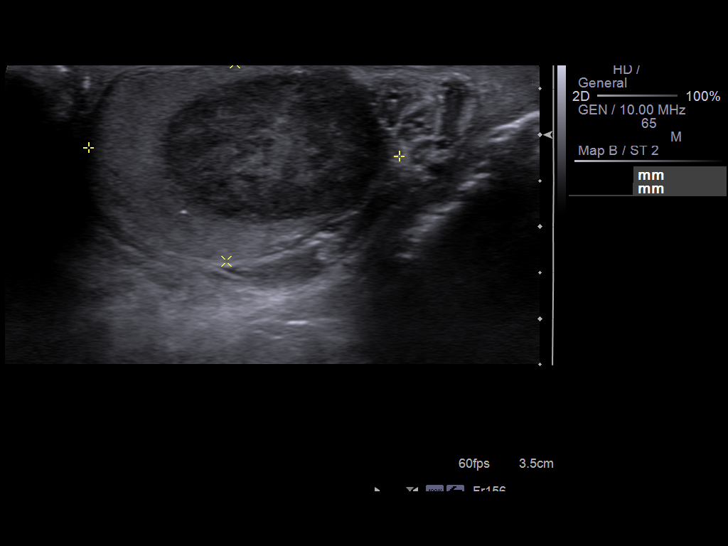
[im 9/53]
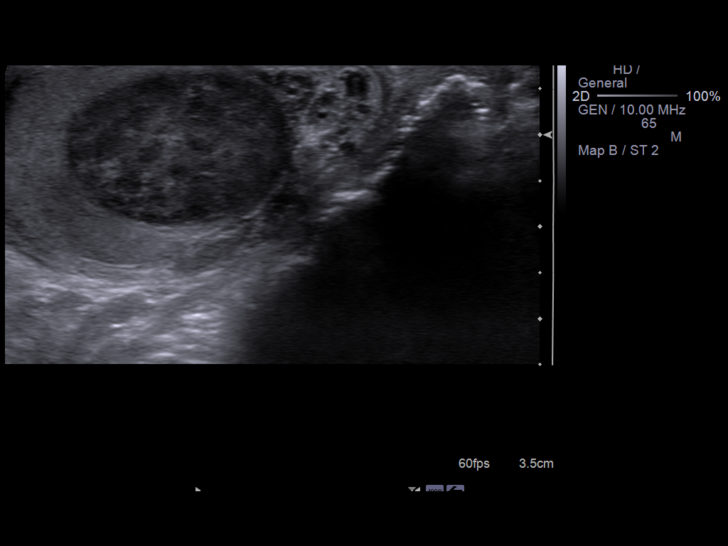
[im 14/53]
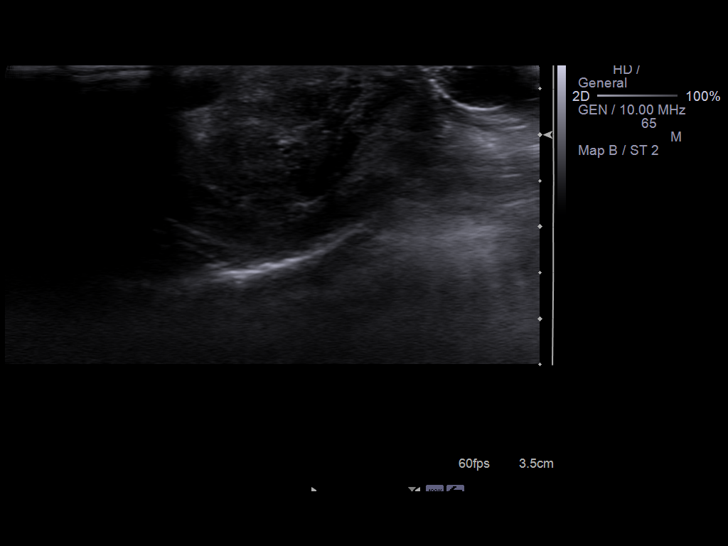
[im 18/53]
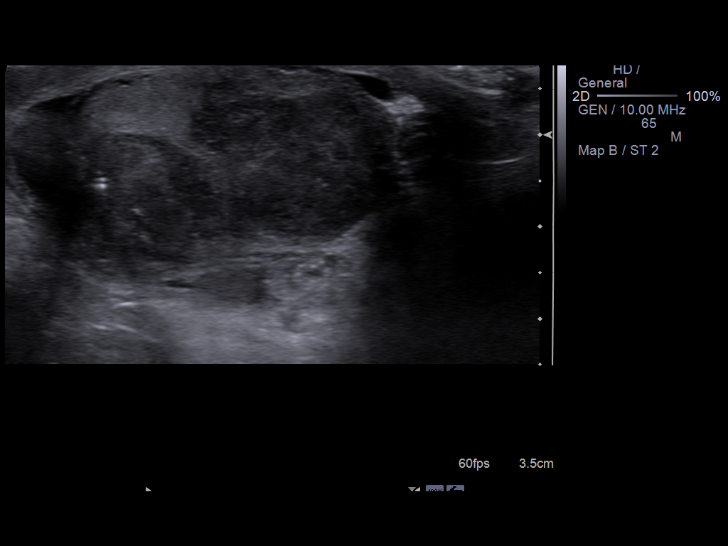
[im 20/53]
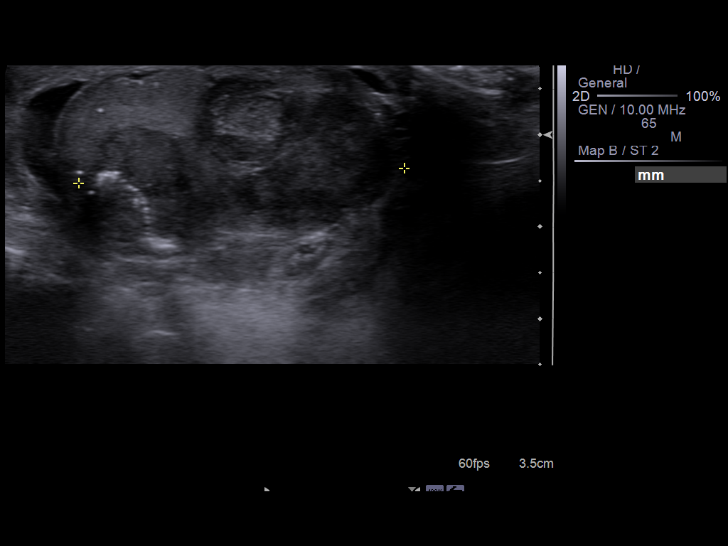
[im 24/53]
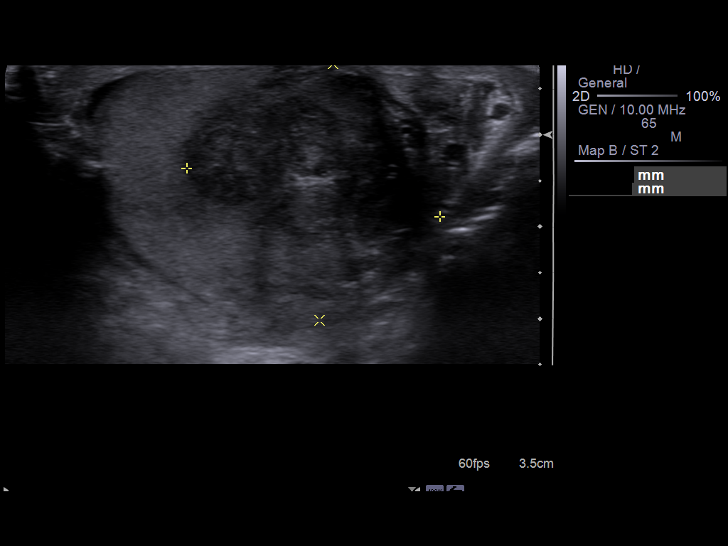
[im 29/53]
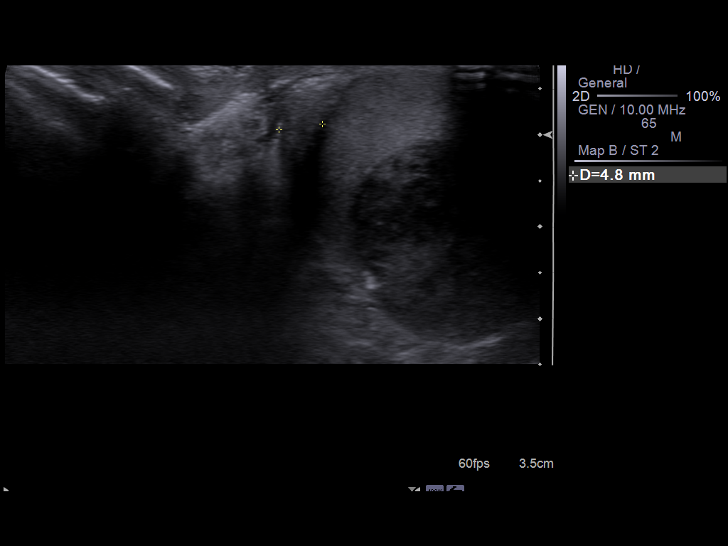
[im 33/53]
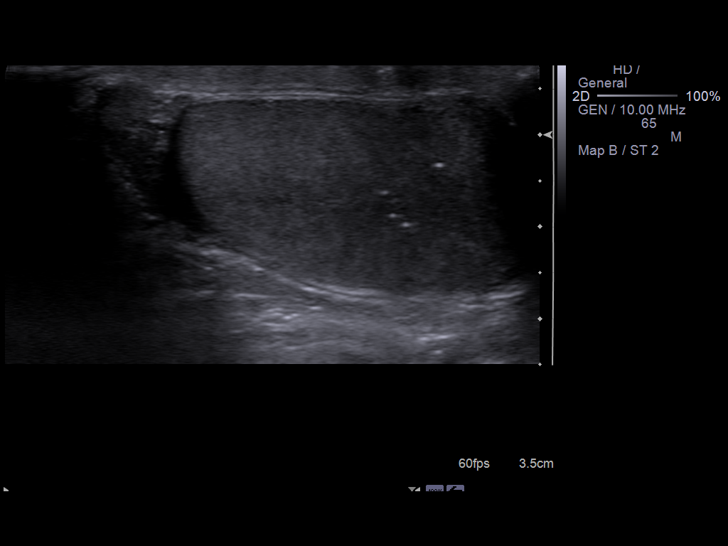
[im 35/53]
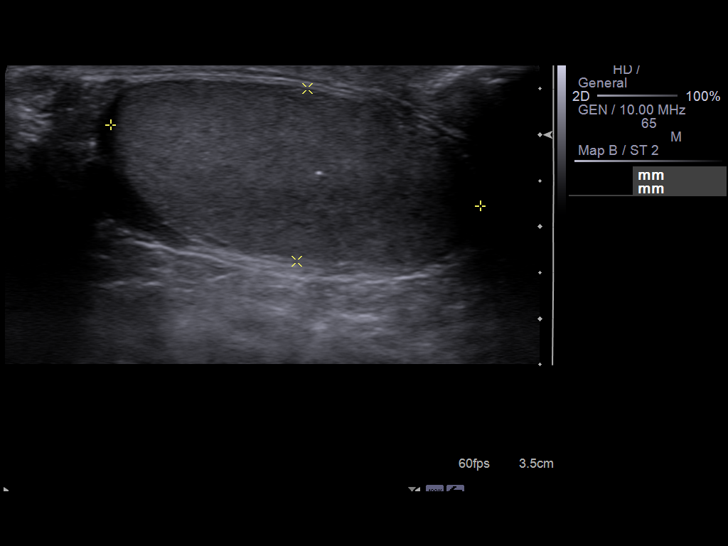
[im 40/53]
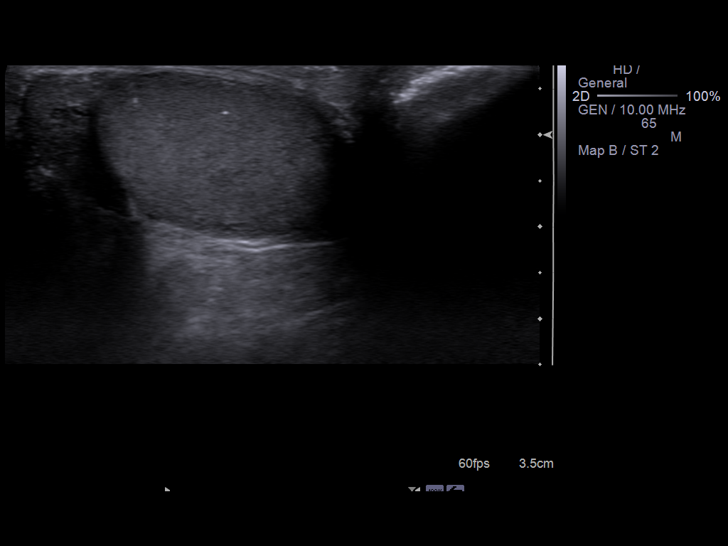
[im 44/53]
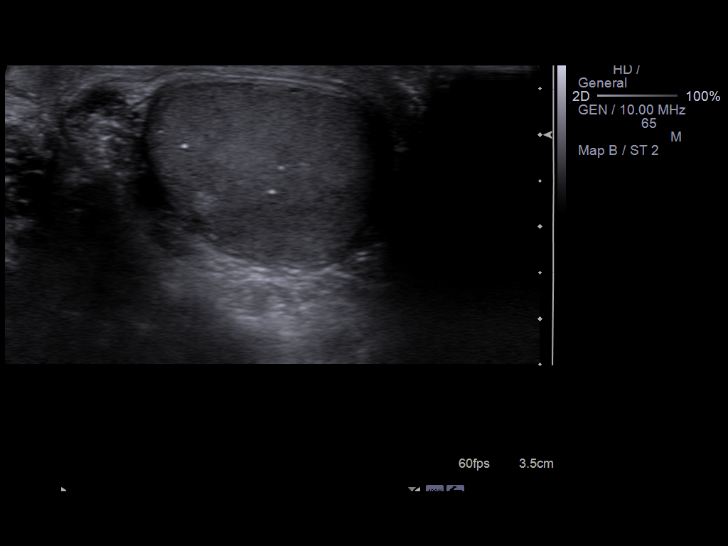
[im 48/53]
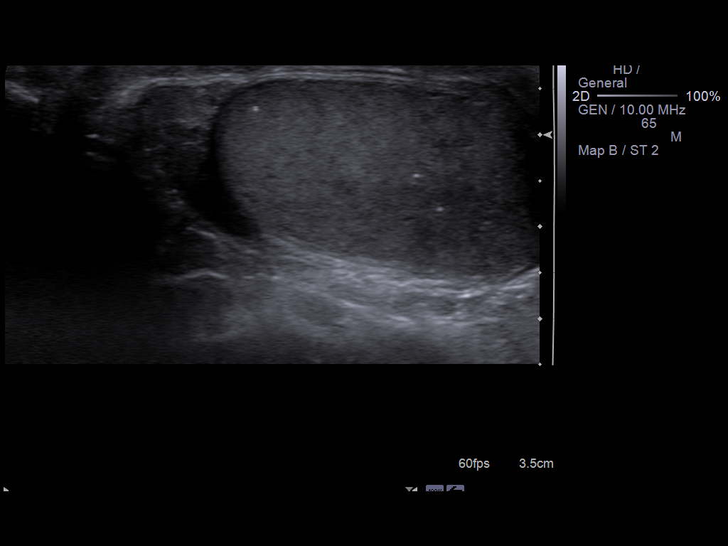
[im 53/53]
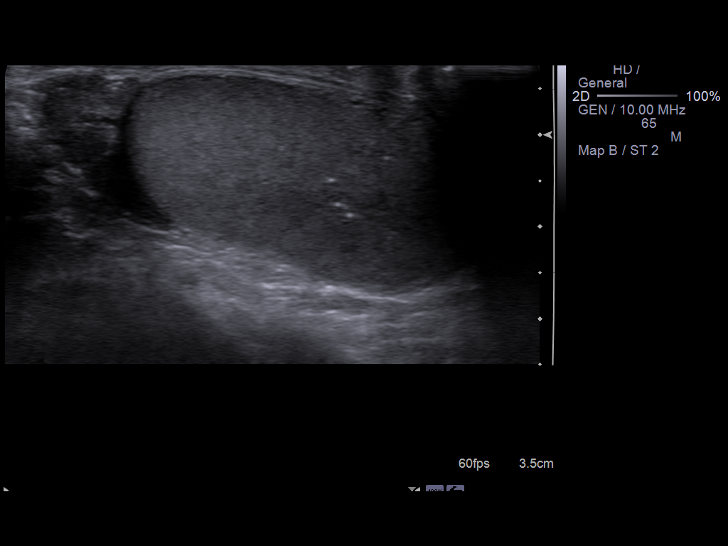

[14 of 25 positions shown; findings below may reference images not displayed]

FINDINGS: Right testicle measures 3.4 x 2.2 x 3.2cm.  Left testicle
measures 4.1 x 1.9 x 2.6cm.  There is a complex solid mass with in
the right testicle measuring 3.5 x 2.8 x 2.8 cm, compatible with
testicular neoplasm.  The mass is hypoechoic with internal blood
flow.

No testicular mass within the left testicle.  There is
microlithiasis.

Epididymi are normal in symmetric bilaterally.  No hydrocele or
varicocele.
IMPRESSION: 3.5 cm right testicular mass compatible with testicular neoplasm.

Left testicular microlithiasis.

## 2011-08-08 ENCOUNTER — Encounter (HOSPITAL_BASED_OUTPATIENT_CLINIC_OR_DEPARTMENT_OTHER): Payer: Self-pay | Admitting: Oncology

## 2011-08-08 ENCOUNTER — Other Ambulatory Visit: Payer: Self-pay | Admitting: Oncology

## 2011-08-08 DIAGNOSIS — C6290 Malignant neoplasm of unspecified testis, unspecified whether descended or undescended: Secondary | ICD-10-CM

## 2011-08-08 LAB — CBC WITH DIFFERENTIAL/PLATELET
BASO%: 0.5 % (ref 0.0–2.0)
Basophils Absolute: 0 10e3/uL (ref 0.0–0.1)
EOS%: 0.6 % (ref 0.0–7.0)
Eosinophils Absolute: 0 10e3/uL (ref 0.0–0.5)
HCT: 39.5 % (ref 38.4–49.9)
HGB: 12.6 g/dL — ABNORMAL LOW (ref 13.0–17.1)
LYMPH%: 26.9 % (ref 14.0–49.0)
MCH: 22.1 pg — ABNORMAL LOW (ref 27.2–33.4)
MCHC: 32 g/dL (ref 32.0–36.0)
MCV: 69.2 fL — ABNORMAL LOW (ref 79.3–98.0)
MONO#: 0.5 10e3/uL (ref 0.1–0.9)
MONO%: 8.6 % (ref 0.0–14.0)
NEUT#: 3.8 10e3/uL (ref 1.5–6.5)
NEUT%: 63.4 % (ref 39.0–75.0)
Platelets: 207 10e3/uL (ref 140–400)
RBC: 5.71 10e6/uL (ref 4.20–5.82)
RDW: 20.4 % — ABNORMAL HIGH (ref 11.0–14.6)
WBC: 6 10e3/uL (ref 4.0–10.3)
lymph#: 1.6 10e3/uL (ref 0.9–3.3)

## 2011-08-13 LAB — COMPREHENSIVE METABOLIC PANEL
ALT: 20 U/L (ref 0–53)
CO2: 28 mEq/L (ref 19–32)
Calcium: 8.7 mg/dL (ref 8.4–10.5)
Chloride: 107 mEq/L (ref 96–112)
Glucose, Bld: 111 mg/dL — ABNORMAL HIGH (ref 70–99)
Sodium: 139 mEq/L (ref 135–145)
Total Protein: 7 g/dL (ref 6.0–8.3)

## 2011-08-13 LAB — LACTATE DEHYDROGENASE: LDH: 166 U/L (ref 94–250)

## 2011-08-15 ENCOUNTER — Other Ambulatory Visit (HOSPITAL_COMMUNITY): Payer: Self-pay

## 2011-08-17 ENCOUNTER — Other Ambulatory Visit: Payer: Self-pay | Admitting: Gastroenterology

## 2011-08-17 ENCOUNTER — Other Ambulatory Visit: Payer: Self-pay

## 2011-08-17 DIAGNOSIS — Z0000 Encounter for general adult medical examination without abnormal findings: Secondary | ICD-10-CM

## 2011-08-17 LAB — HEMOCCULT SLIDES (X 3 CARDS)
Fecal Occult Blood: NEGATIVE
OCCULT 1: NEGATIVE
OCCULT 2: NEGATIVE
OCCULT 3: NEGATIVE
OCCULT 5: NEGATIVE

## 2011-08-21 ENCOUNTER — Other Ambulatory Visit: Payer: Self-pay | Admitting: Gastroenterology

## 2011-08-21 ENCOUNTER — Ambulatory Visit (HOSPITAL_COMMUNITY)
Admission: RE | Admit: 2011-08-21 | Discharge: 2011-08-21 | Disposition: A | Discharge status: Home / Self Care | Payer: Self-pay | Source: Ambulatory Visit | Attending: Oncology | Admitting: Oncology

## 2011-08-21 ENCOUNTER — Encounter (HOSPITAL_COMMUNITY): Payer: Self-pay

## 2011-08-21 DIAGNOSIS — N2889 Other specified disorders of kidney and ureter: Secondary | ICD-10-CM | POA: Insufficient documentation

## 2011-08-21 DIAGNOSIS — C6290 Malignant neoplasm of unspecified testis, unspecified whether descended or undescended: Secondary | ICD-10-CM | POA: Insufficient documentation

## 2011-08-21 DIAGNOSIS — K7689 Other specified diseases of liver: Secondary | ICD-10-CM | POA: Insufficient documentation

## 2011-08-21 DIAGNOSIS — K668 Other specified disorders of peritoneum: Secondary | ICD-10-CM | POA: Insufficient documentation

## 2011-08-21 DIAGNOSIS — R1900 Intra-abdominal and pelvic swelling, mass and lump, unspecified site: Secondary | ICD-10-CM

## 2011-08-21 MED ORDER — IOHEXOL 300 MG/ML  SOLN
100.0000 mL | Freq: Once | INTRAMUSCULAR | Status: AC | PRN
  Administered 2011-08-21: 100 mL via INTRAVENOUS

## 2011-08-24 ENCOUNTER — Other Ambulatory Visit (INDEPENDENT_AMBULATORY_CARE_PROVIDER_SITE_OTHER): Payer: Self-pay

## 2011-08-24 DIAGNOSIS — R1900 Intra-abdominal and pelvic swelling, mass and lump, unspecified site: Secondary | ICD-10-CM

## 2011-08-24 LAB — IGA: IgA: 496 mg/dL — ABNORMAL HIGH (ref 68–378)

## 2011-08-25 LAB — TISSUE TRANSGLUTAMINASE, IGA: Tissue Transglutaminase Ab, IgA: 11.3 U/mL (ref <20)

## 2011-08-25 LAB — RETICULIN ANTIBODIES, IGA W TITER: Reticulin Ab, IgA: NEGATIVE

## 2011-08-25 LAB — GLIADIN ANTIBODIES, SERUM: Gliadin IgA: 11.8 U/mL (ref <20)

## 2011-08-30 ENCOUNTER — Encounter: Payer: Self-pay | Admitting: Gastroenterology

## 2011-11-09 ENCOUNTER — Other Ambulatory Visit: Payer: Self-pay | Admitting: Oncology

## 2011-11-09 ENCOUNTER — Other Ambulatory Visit (HOSPITAL_BASED_OUTPATIENT_CLINIC_OR_DEPARTMENT_OTHER): Payer: Self-pay | Admitting: Lab

## 2011-11-09 DIAGNOSIS — C6290 Malignant neoplasm of unspecified testis, unspecified whether descended or undescended: Secondary | ICD-10-CM

## 2011-11-09 LAB — CBC WITH DIFFERENTIAL/PLATELET
BASO%: 0.4 % (ref 0.0–2.0)
EOS%: 1.8 % (ref 0.0–7.0)
MCH: 24.8 pg — ABNORMAL LOW (ref 27.2–33.4)
MCHC: 32.6 g/dL (ref 32.0–36.0)
MCV: 76.1 fL — ABNORMAL LOW (ref 79.3–98.0)
MONO%: 9.3 % (ref 0.0–14.0)
RDW: 21 % — ABNORMAL HIGH (ref 11.0–14.6)
lymph#: 2 10*3/uL (ref 0.9–3.3)

## 2011-11-13 LAB — BETA HCG QUANT (REF LAB): Beta hCG, Tumor Marker: <0.5 m[IU]/mL (ref <5.0)

## 2011-11-17 ENCOUNTER — Telehealth: Payer: Self-pay | Admitting: *Deleted

## 2011-11-17 NOTE — Telephone Encounter (Signed)
Message copied by Wandalee Ferdinand on Fri Nov 17, 2011  7:04 PM ------      Message from: Thornton Papas B      Created: Thu Nov 16, 2011 10:52 PM       Please call patient, tumor markers are normal, f/u as scheduled

## 2011-12-25 ENCOUNTER — Other Ambulatory Visit: Payer: Self-pay | Admitting: Lab

## 2011-12-26 ENCOUNTER — Other Ambulatory Visit: Payer: Self-pay | Admitting: Lab

## 2012-01-09 ENCOUNTER — Encounter: Payer: Self-pay | Admitting: Oncology

## 2012-01-09 ENCOUNTER — Ambulatory Visit (HOSPITAL_BASED_OUTPATIENT_CLINIC_OR_DEPARTMENT_OTHER): Payer: Self-pay | Admitting: Nurse Practitioner

## 2012-01-09 ENCOUNTER — Other Ambulatory Visit (HOSPITAL_BASED_OUTPATIENT_CLINIC_OR_DEPARTMENT_OTHER): Payer: Self-pay

## 2012-01-09 DIAGNOSIS — E611 Iron deficiency: Secondary | ICD-10-CM

## 2012-01-09 DIAGNOSIS — C6290 Malignant neoplasm of unspecified testis, unspecified whether descended or undescended: Secondary | ICD-10-CM

## 2012-01-09 LAB — CBC WITH DIFFERENTIAL/PLATELET
BASO%: 0.8 % (ref 0.0–2.0)
Basophils Absolute: 0.1 10e3/uL (ref 0.0–0.1)
EOS%: 0.9 % (ref 0.0–7.0)
Eosinophils Absolute: 0.1 10e3/uL (ref 0.0–0.5)
HCT: 44 % (ref 38.4–49.9)
HGB: 14.4 g/dL (ref 13.0–17.1)
LYMPH%: 28.1 % (ref 14.0–49.0)
MCH: 25.2 pg — ABNORMAL LOW (ref 27.2–33.4)
MCHC: 32.8 g/dL (ref 32.0–36.0)
MCV: 76.9 fL — ABNORMAL LOW (ref 79.3–98.0)
MONO#: 0.6 10e3/uL (ref 0.1–0.9)
MONO%: 9.7 % (ref 0.0–14.0)
NEUT#: 3.8 10e3/uL (ref 1.5–6.5)
NEUT%: 60.5 % (ref 39.0–75.0)
Platelets: 191 10e3/uL (ref 140–400)
RBC: 5.73 10e6/uL (ref 4.20–5.82)
RDW: 17.2 % — ABNORMAL HIGH (ref 11.0–14.6)
WBC: 6.3 10e3/uL (ref 4.0–10.3)
lymph#: 1.8 10e3/uL (ref 0.9–3.3)

## 2012-01-09 NOTE — Progress Notes (Signed)
OFFICE PROGRESS NOTE  Interval history:  Keith Price returns for a followup visit after missing a visit in October 2012. He feels well. No interim illnesses or infections. He denies abdominal pain. Bowels moving regularly. No hematochezia or melena. No hematuria or dysuria. He has a good appetite. He is gaining weight. He has a good energy level. No shortness of breath or cough. Stable mild chronic back pain. Only complaint today is recent leg cramps at night time.  Objective: Blood pressure 126/75, pulse 71, temperature 97.5 F (36.4 C), temperature source Oral, height 5\' 7"  (1.702 m), weight 217 lb 12.8 oz (98.793 kg).  Oropharynx is without thrush or ulceration. No palpable cervical, supraclavicular, axillary or inguinal lymph nodes. Lungs are clear. No wheezes or rales. Regular cardiac rhythm. Abdomen is soft and nontender. No mass. No organomegaly. Extremities are without edema. Calves are soft and nontender.  Lab Results: Lab Results  Component Value Date   WBC 6.3 01/09/2012   HGB 14.4 01/09/2012   HCT 44.0 01/09/2012   MCV 76.9* 01/09/2012   PLT 191 01/09/2012    Chemistry:    Chemistry      Component Value Date/Time   NA 139 08/08/2011 1434   NA 139 08/08/2011 1434   NA 139 08/08/2011 1434   K 3.9 08/08/2011 1434   K 3.9 08/08/2011 1434   K 3.9 08/08/2011 1434   CL 107 08/08/2011 1434   CL 107 08/08/2011 1434   CL 107 08/08/2011 1434   CO2 28 08/08/2011 1434   CO2 28 08/08/2011 1434   CO2 28 08/08/2011 1434   BUN 20 08/08/2011 1434   BUN 20 08/08/2011 1434   BUN 20 08/08/2011 1434   CREATININE 0.96 08/08/2011 1434   CREATININE 0.96 08/08/2011 1434   CREATININE 0.96 08/08/2011 1434      Component Value Date/Time   CALCIUM 8.7 08/08/2011 1434   CALCIUM 8.7 08/08/2011 1434   CALCIUM 8.7 08/08/2011 1434   ALKPHOS 107 08/08/2011 1434   ALKPHOS 107 08/08/2011 1434   ALKPHOS 107 08/08/2011 1434   AST 18 08/08/2011 1434   AST 18 08/08/2011 1434   AST 18 08/08/2011 1434   ALT 20 08/08/2011  1434   ALT 20 08/08/2011 1434   ALT 20 08/08/2011 1434   BILITOT 0.6 08/08/2011 1434   BILITOT 0.6 08/08/2011 1434   BILITOT 0.6 08/08/2011 1434       Studies/Results: No results found.  Medications: I have reviewed the patient's current medications.  Assessment/Plan:  1. 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  Staging CT scan showed a large right retroperitoneal mass, scattered tiny bilateral pulmonary parenchymal nodules and a 1-cm subtle lesion in the posterior right liver.  Brain CT was negative for evidence of metastatic disease.  He completed 4 cycles of BEP chemotherapy with the last cycle given June 14, 2010.  The alpha-fetoprotein and beta HCG tumor markers were normal on July 10, 2010.  Restaging CTs of the chest, abdomen and pelvis on September 09, 2010, revealed resolution of nodular densities in the right lung, a decrease in the size of liver lesions and a further decrease in the size of the right retroperitoneal mass.  New inflammatory changes were noted in the right lung.  He underwent a right hepatectomy and retroperitoneal lymph node dissection at [facility] on November 15, 2010.  The pathology confirmed no evidence  of malignancy in the liver or retroperitoneal lymph nodes.  The precaval mass contained mature teratoma and no immature elements or malignancy were identified.  Alpha-fetoprotein and beta HCG tumor markers were in normal range on May 11, 2011.  Chest x-ray on May 10, 2011, was negative as well. Alpha-fetoprotein and beta hCG tumor markers were in normal range 08/08/2011 and 11/09/2011. Restaging CT scans of the chest, abdomen and pelvis 08/21/2011 showed no evidence of metastatic disease. There was stable to minimal improvement in left-sided lung nodules. There was no evidence of residual hepatic metastasis. New low-density  left retroperitoneal "lesions" were favored to represent postoperative lymphoceles or seromas . Necrotic nodal metastasis was felt to be less likely. A previously noted necrotic "lesion" at the precaval space of the right retroperitoneum had resolved or been  resected. 2. Obstruction of the proximal right ureter and moderate right hydronephrosis secondary to the large right retroperitoneal mass status post placement of double-J stent Mar 16, 2010. 3. Abdominal pain secondary to the right retroperitoneal mass, resolved. 4. History of rate related reaction to etoposide. 5. Elevated LDH, alpha-fetoprotein and beta HCG at the time of diagnosis May 2011.  The tumor markers were normal on December 28, 2010, February 09, 2011, May 11, 2011, 08/08/2011 and 11/09/2011. 6. History of intermittent dysuria, resolved. 7. History of "hemorrhoids." Hemorrhoids were noted on the colonoscopy 07/19/2011. 8. History of chills and sweats following bleomycin. 9. History of thrombocytopenia secondary to chemotherapy. 10. 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 11. Mild microcytic anemia with labs confirming iron deficiency with a ferritin of 5 on 05/11/2011. Stool Hemoccult was negative x3 on June 06, 2011.  A urinalysis was negative for blood on May 23, 2011. He underwent a colonoscopy by Dr. Christella Hartigan on 07/19/2011 with findings of small external hemorrhoids with an otherwise normal examination. Stool Hemoccult negative on 08/17/2011. The hemoglobin has corrected to normal range. He continues to have a red cell microcytosis.  Disposition-Keith Price appears well. He remains in remission from testicular cancer. We will followup on the tumor markers from today. We are adding a ferritin to today's labs. If the ferritin is low we will contact him with instructions regarding a trial of oral iron. He will return for a followup visit in 3 months. He will  contact the office in the interim with any problems.  Plan reviewed with Dr. Truett Perna.  An interpreter was present throughout today's visit.  Lonna Cobb ANP/GNP-BC

## 2012-01-11 IMAGING — CR DG TOE GREAT 2+V*L*
3 series · 3 of 3 positions shown · non-contrast
Comparison: None.

CLINICAL DATA: Trauma, pain

LEFT TOE - 2+ VIEW

[t toes ap left]
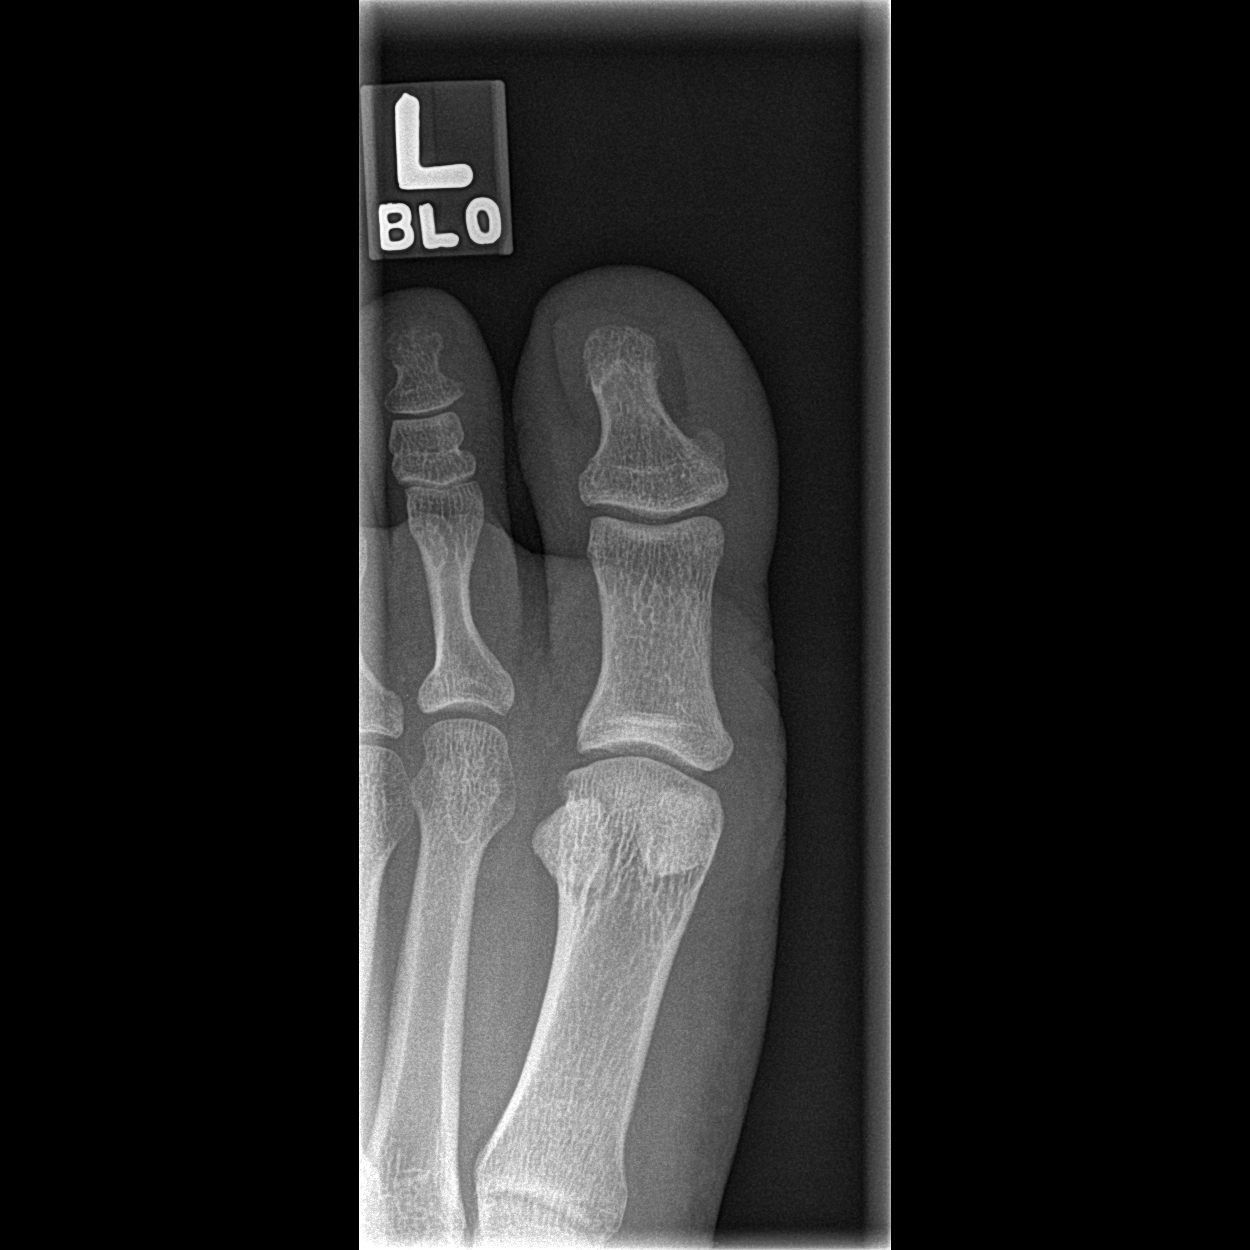

[t toes oblique left]
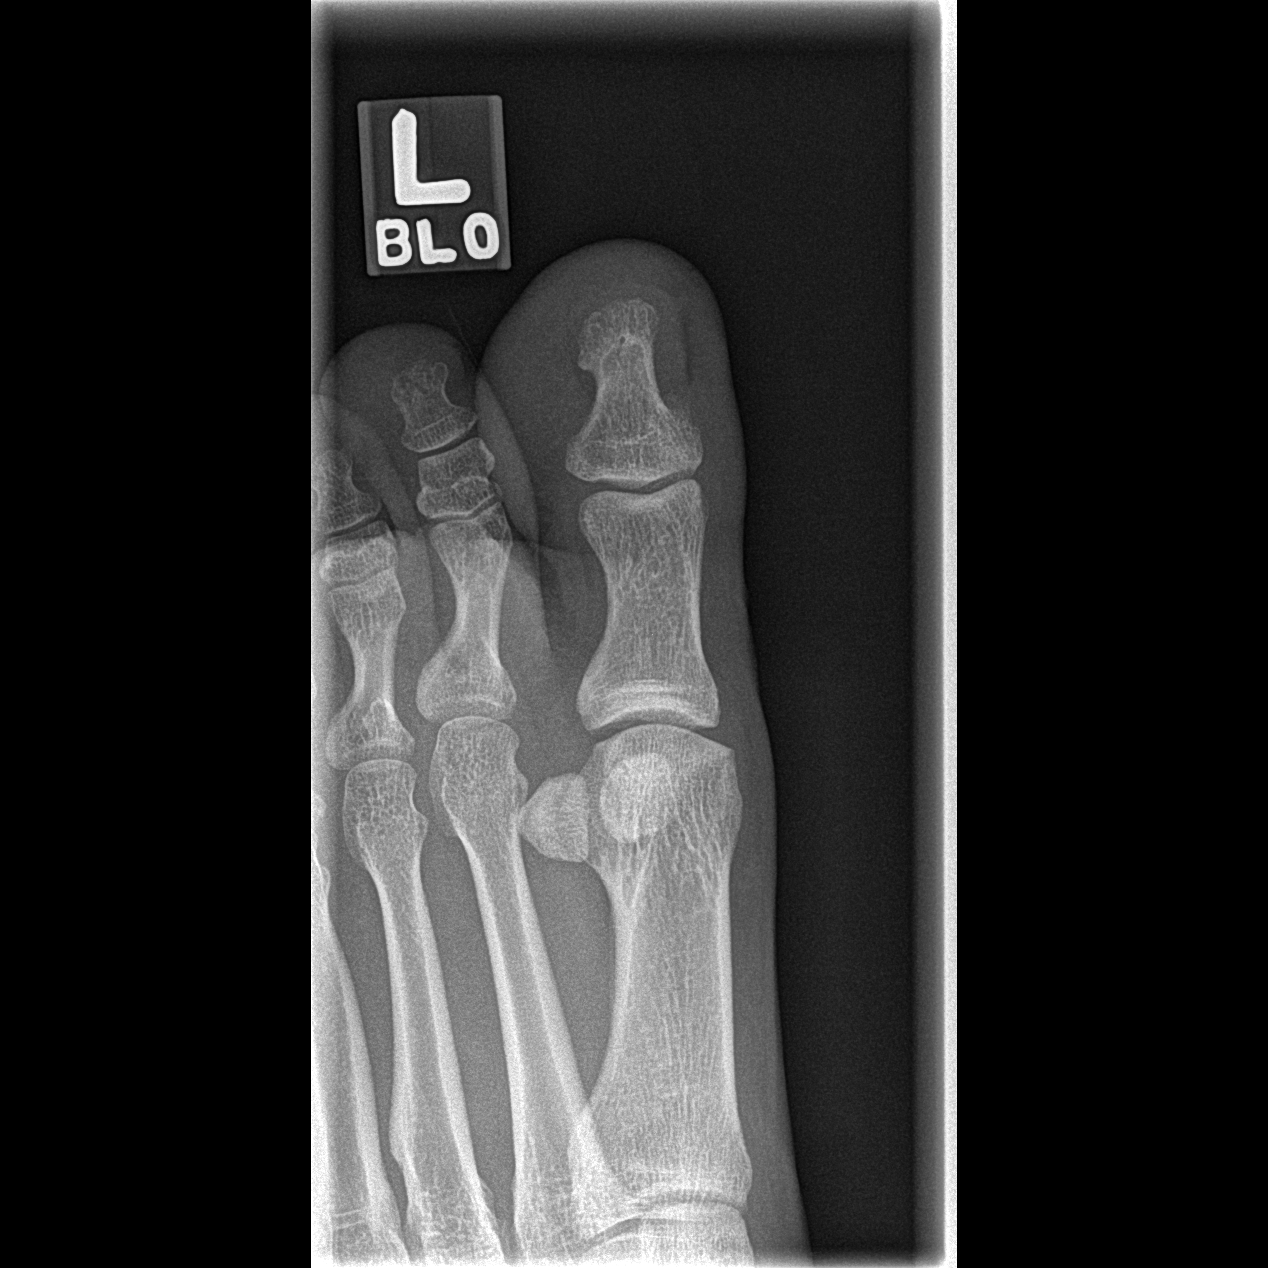

[t toes lateral left]
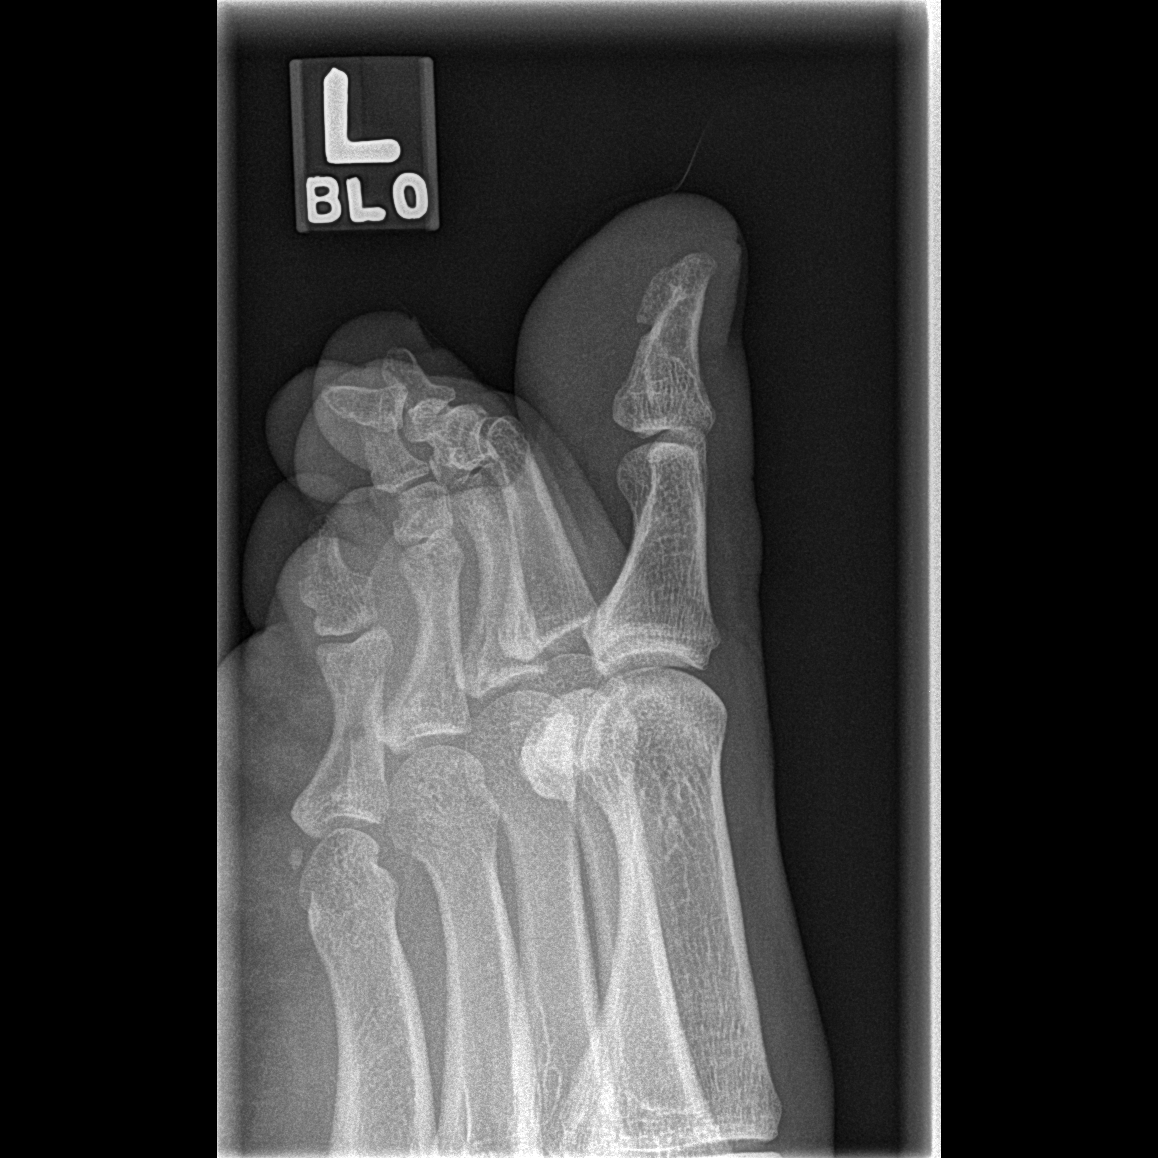

[3 of 3 positions shown; findings below may reference images not displayed]

FINDINGS: Normal alignment.  No fracture.  Preserved joint spaces.
No radiographic foreign body or swelling.
IMPRESSION: No acute osseous finding

## 2012-01-12 LAB — BETA HCG QUANT (REF LAB): Beta hCG, Tumor Marker: <0.5 m[IU]/mL (ref <5.0)

## 2012-01-17 ENCOUNTER — Telehealth: Payer: Self-pay | Admitting: *Deleted

## 2012-01-17 DIAGNOSIS — E611 Iron deficiency: Secondary | ICD-10-CM

## 2012-01-17 NOTE — Telephone Encounter (Signed)
Notified patient of results and need to check urine and stool for blood since iron still low. He agrees to come on 3/15 at 8am.

## 2012-01-17 NOTE — Telephone Encounter (Signed)
Message copied by Wandalee Ferdinand on Wed Jan 17, 2012  3:33 PM ------      Message from: Thornton Papas B      Created: Mon Jan 15, 2012  9:36 PM       Please call patient, tumor markers are negative.  Iron is still low.            Obtain a urinalysis and repeat set of stool hemoccults prior to next office

## 2012-01-17 NOTE — Telephone Encounter (Signed)
Message copied by Wandalee Ferdinand on Wed Jan 17, 2012  1:59 PM ------      Message from: Thornton Papas B      Created: Mon Jan 15, 2012  9:36 PM       Please call patient, tumor markers are negative.  Iron is still low.            Obtain a urinalysis and repeat set of stool hemoccults prior to next office

## 2012-01-26 ENCOUNTER — Other Ambulatory Visit (HOSPITAL_BASED_OUTPATIENT_CLINIC_OR_DEPARTMENT_OTHER): Payer: Self-pay

## 2012-01-26 DIAGNOSIS — D509 Iron deficiency anemia, unspecified: Secondary | ICD-10-CM

## 2012-01-26 DIAGNOSIS — E611 Iron deficiency: Secondary | ICD-10-CM

## 2012-01-26 LAB — URINALYSIS, MICROSCOPIC - CHCC
Blood: NEGATIVE
Glucose: NEGATIVE g/dL
Leukocyte Esterase: NEGATIVE
Nitrite: NEGATIVE
Protein: NEGATIVE mg/dL
Specific Gravity, Urine: 1.025 (ref 1.003–1.035)
pH: 6.5 (ref 4.6–8.0)

## 2012-04-09 ENCOUNTER — Ambulatory Visit (HOSPITAL_BASED_OUTPATIENT_CLINIC_OR_DEPARTMENT_OTHER): Payer: Self-pay | Admitting: Oncology

## 2012-04-09 ENCOUNTER — Other Ambulatory Visit (HOSPITAL_BASED_OUTPATIENT_CLINIC_OR_DEPARTMENT_OTHER): Payer: Self-pay | Admitting: Lab

## 2012-04-09 ENCOUNTER — Telehealth: Payer: Self-pay | Admitting: Oncology

## 2012-04-09 ENCOUNTER — Ambulatory Visit (HOSPITAL_COMMUNITY)
Admission: RE | Admit: 2012-04-09 | Discharge: 2012-04-09 | Disposition: A | Discharge status: Home / Self Care | Payer: Self-pay | Source: Ambulatory Visit | Attending: Oncology | Admitting: Oncology

## 2012-04-09 VITALS — BP 108/69 | HR 75 | Temp 97.8°F | Wt 214.0 lb

## 2012-04-09 DIAGNOSIS — E611 Iron deficiency: Secondary | ICD-10-CM

## 2012-04-09 DIAGNOSIS — C6290 Malignant neoplasm of unspecified testis, unspecified whether descended or undescended: Secondary | ICD-10-CM

## 2012-04-09 DIAGNOSIS — D509 Iron deficiency anemia, unspecified: Secondary | ICD-10-CM

## 2012-04-09 LAB — CBC WITH DIFFERENTIAL/PLATELET
Basophils Absolute: 0 10*3/uL (ref 0.0–0.1)
Eosinophils Absolute: 0 10*3/uL (ref 0.0–0.5)
HCT: 46.3 % (ref 38.4–49.9)
HGB: 15.1 g/dL (ref 13.0–17.1)
LYMPH%: 27.1 % (ref 14.0–49.0)
MCV: 79.2 fL — ABNORMAL LOW (ref 79.3–98.0)
MONO#: 0.4 10*3/uL (ref 0.1–0.9)
MONO%: 7.8 % (ref 0.0–14.0)
NEUT#: 3.3 10*3/uL (ref 1.5–6.5)
NEUT%: 63.5 % (ref 39.0–75.0)
Platelets: 199 10*3/uL (ref 140–400)
RBC: 5.84 10*6/uL — ABNORMAL HIGH (ref 4.20–5.82)
WBC: 5.2 10*3/uL (ref 4.0–10.3)
nRBC: 0 % (ref 0–0)

## 2012-04-09 NOTE — Telephone Encounter (Signed)
Gave pt appt for August 2013 lab and ML, sent pt today for chest x-ray @ [facility]

## 2012-04-09 NOTE — Progress Notes (Signed)
[facility]    OFFICE PROGRESS NOTE   INTERVAL HISTORY:   He returns as scheduled. No complaint. No shortness of breath or bleeding. No abdominal pain.  Objective:  Vital signs in last 24 hours:  Blood pressure 108/69, pulse 75, temperature 97.8 F (36.6 C), temperature source Oral, weight 214 lb (97.07 kg).    HEENT: Neck without mass Lymphatics: No cervical, supraclavicular, axillary, or inguinal nodes Resp: Lungs clear bilaterally Cardio: Regular rate and rhythm GI: No hepatomegaly, no mass, tenderness Vascular: No leg edema GU-left testis without mass.      Lab Results:  Lab Results  Component Value Date   WBC 5.2 04/09/2012   HGB 15.1 04/09/2012   HCT 46.3 04/09/2012   MCV 79.2* 04/09/2012   PLT 199 04/09/2012      Medications: I have reviewed the patient's current medications.  Assessment/Plan: 1. 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 Staging CT scan showed a large right retroperitoneal mass, scattered tiny bilateral pulmonary parenchymal nodules and a 1-cm subtle lesion in the posterior right liver. Brain CT was negative for evidence of metastatic disease. He completed 4 cycles of BEP chemotherapy with the last cycle given June 14, 2010. The alpha-fetoprotein and beta HCG tumor markers were normal on July 10, 2010. Restaging CTs of the chest, abdomen and pelvis on September 09, 2010, revealed resolution of nodular densities in the right lung, a decrease in the size of liver lesions and a further decrease in the size of the right retroperitoneal mass. New inflammatory changes were noted in the right lung. He underwent a right hepatectomy and retroperitoneal lymph node dissection at [facility] on November 15, 2010. The pathology confirmed no evidence of malignancy in the liver or retroperitoneal lymph  nodes. The precaval mass contained mature teratoma and no immature elements or malignancy were identified. Alpha-fetoprotein and beta HCG tumor markers were in normal range on May 11, 2011. Chest x-ray on May 10, 2011, was negative as well. Alpha-fetoprotein and beta hCG tumor markers were in normal range 08/08/2011 and 11/09/2011. Restaging CT scans of the chest, abdomen and pelvis 08/21/2011 showed no evidence of metastatic disease. There was stable to minimal improvement in left-sided lung nodules. There was no evidence of residual hepatic metastasis. New low-density left retroperitoneal "lesions" were favored to represent postoperative lymphoceles or seromas . Necrotic nodal metastasis was felt to be less likely. A previously noted necrotic "lesion" at the precaval space of the right retroperitoneum had resolved or been resected. 2. Obstruction of the proximal right ureter and moderate right hydronephrosis secondary to the large right retroperitoneal mass status post placement of double-J stent Mar 16, 2010. 3. Abdominal pain secondary to the right retroperitoneal mass, resolved. 4. History of rate related reaction to etoposide. 5. Elevated LDH, alpha-fetoprotein and beta HCG at the time of diagnosis May 2011. The tumor markers were normal on December 28, 2010, February 09, 2011, May 11, 2011, 08/08/2011, 11/09/2011, and 01/09/2012. 6. History of intermittent dysuria, resolved. 7. History of "hemorrhoids." Hemorrhoids were noted on the colonoscopy 07/19/2011. 8. History of chills and sweats following bleomycin. 9. History of thrombocytopenia secondary to chemotherapy. 10. 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 11. Mild microcytic anemia with labs confirming iron deficiency with a ferritin of 5 on 05/11/2011. Stool Hemoccult was negative x3 on  June 06, 2011. A urinalysis was negative for blood on May 23, 2011. He underwent a  colonoscopy by Dr. Christella Hartigan on 07/19/2011 with findings of small external hemorrhoids with an otherwise normal examination. Stool Hemoccult negative on 08/17/2011. The hemoglobin has corrected to normal range. He continues to have a red cell microcytosis.   Disposition:  He remains in clinical remission from the testicular cancer. We will followup on the AFP and beta hCG levels from today. He will undergo a restaging chest x-ray today.  He will return for an office and lab visit in 3 months. The etiology of the red cell microcytosis and iron deficiency remains unclear. No source for bleeding has been identified.   Thornton Papas, MD  04/09/2012  5:12 PM

## 2012-04-16 ENCOUNTER — Telehealth: Payer: Self-pay | Admitting: *Deleted

## 2012-04-16 NOTE — Telephone Encounter (Signed)
Message copied by Wandalee Ferdinand on Tue Apr 16, 2012  1:09 PM ------      Message from: Ladene Artist      Created: Tue Apr 09, 2012 10:09 PM       Please call patient, cxr is negative

## 2012-04-16 NOTE — Telephone Encounter (Signed)
Informed Hakim that CXR was negative.

## 2012-07-10 ENCOUNTER — Ambulatory Visit (HOSPITAL_BASED_OUTPATIENT_CLINIC_OR_DEPARTMENT_OTHER): Payer: Self-pay | Admitting: Nurse Practitioner

## 2012-07-10 ENCOUNTER — Telehealth: Payer: Self-pay | Admitting: Oncology

## 2012-07-10 ENCOUNTER — Other Ambulatory Visit (HOSPITAL_BASED_OUTPATIENT_CLINIC_OR_DEPARTMENT_OTHER): Payer: Self-pay

## 2012-07-10 VITALS — BP 106/68 | HR 68 | Temp 96.9°F | Resp 20 | Ht 67.0 in | Wt 215.0 lb

## 2012-07-10 DIAGNOSIS — D509 Iron deficiency anemia, unspecified: Secondary | ICD-10-CM

## 2012-07-10 DIAGNOSIS — C6290 Malignant neoplasm of unspecified testis, unspecified whether descended or undescended: Secondary | ICD-10-CM

## 2012-07-10 DIAGNOSIS — R109 Unspecified abdominal pain: Secondary | ICD-10-CM

## 2012-07-10 DIAGNOSIS — R911 Solitary pulmonary nodule: Secondary | ICD-10-CM

## 2012-07-10 LAB — CBC WITH DIFFERENTIAL/PLATELET
Basophils Absolute: 0 10*3/uL (ref 0.0–0.1)
Eosinophils Absolute: 0 10*3/uL (ref 0.0–0.5)
HGB: 15.7 g/dL (ref 13.0–17.1)
LYMPH%: 28.9 % (ref 14.0–49.0)
MCV: 82.8 fL (ref 79.3–98.0)
MONO#: 0.5 10*3/uL (ref 0.1–0.9)
NEUT#: 3.3 10*3/uL (ref 1.5–6.5)
Platelets: 157 10*3/uL (ref 140–400)
RBC: 5.71 10*6/uL (ref 4.20–5.82)
WBC: 5.4 10*3/uL (ref 4.0–10.3)

## 2012-07-10 NOTE — Telephone Encounter (Signed)
appts made and printed for pt aom °

## 2012-07-10 NOTE — Progress Notes (Signed)
OFFICE PROGRESS NOTE  Interval history:  [name] returns as scheduled. He feels well. He has a good appetite and energy level. Bowels moving regularly. No urinary symptoms. No nausea or vomiting. He denies shortness of breath. No bleeding. Over the past 2 weeks he has had intermittent "crampy" pain at the right abdomen.   Objective: Blood pressure 106/68, pulse 68, temperature 96.9 F (36.1 C), temperature source Oral, resp. rate 20, height 5\' 7"  (1.702 m), weight 215 lb (97.523 kg).  Oropharynx is without thrush or ulceration. No palpable cervical, supraclavicular, axillary or inguinal lymph nodes. Lungs clear. No wheezes or rales. Regular cardiac rhythm. Abdomen is soft and nontender. No mass. No organomegaly. Extremities are without edema.  Lab Results: Lab Results  Component Value Date   WBC 5.4 07/10/2012   HGB 15.7 07/10/2012   HCT 47.3 07/10/2012   MCV 82.8 07/10/2012   PLT 157 07/10/2012    Chemistry:    Chemistry      Component Value Date/Time   NA 139 08/08/2011 1434   NA 139 08/08/2011 1434   NA 139 08/08/2011 1434   K 3.9 08/08/2011 1434   K 3.9 08/08/2011 1434   K 3.9 08/08/2011 1434   CL 107 08/08/2011 1434   CL 107 08/08/2011 1434   CL 107 08/08/2011 1434   CO2 28 08/08/2011 1434   CO2 28 08/08/2011 1434   CO2 28 08/08/2011 1434   BUN 20 08/08/2011 1434   BUN 20 08/08/2011 1434   BUN 20 08/08/2011 1434   CREATININE 0.96 08/08/2011 1434   CREATININE 0.96 08/08/2011 1434   CREATININE 0.96 08/08/2011 1434      Component Value Date/Time   CALCIUM 8.7 08/08/2011 1434   CALCIUM 8.7 08/08/2011 1434   CALCIUM 8.7 08/08/2011 1434   ALKPHOS 107 08/08/2011 1434   ALKPHOS 107 08/08/2011 1434   ALKPHOS 107 08/08/2011 1434   AST 18 08/08/2011 1434   AST 18 08/08/2011 1434   AST 18 08/08/2011 1434   ALT 20 08/08/2011 1434   ALT 20 08/08/2011 1434   ALT 20 08/08/2011 1434   BILITOT 0.6 08/08/2011 1434   BILITOT 0.6 08/08/2011 1434   BILITOT 0.6 08/08/2011 1434        Studies/Results: No results found.  Medications: I have reviewed the patient's current medications.  Assessment/Plan:  1. 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 Staging CT scan showed a large right retroperitoneal mass, scattered tiny bilateral pulmonary parenchymal nodules and a 1-cm subtle lesion in the posterior right liver. Brain CT was negative for evidence of metastatic disease. He completed 4 cycles of BEP chemotherapy with the last cycle given June 14, 2010. The alpha-fetoprotein and beta HCG tumor markers were normal on July 10, 2010. Restaging CTs of the chest, abdomen and pelvis on September 09, 2010, revealed resolution of nodular densities in the right lung, a decrease in the size of liver lesions and a further decrease in the size of the right retroperitoneal mass. New inflammatory changes were noted in the right lung. He underwent a right hepatectomy and retroperitoneal lymph node dissection at [facility] on November 15, 2010. The pathology confirmed no evidence of malignancy in the liver or retroperitoneal lymph nodes. The precaval mass contained mature teratoma and no immature elements or malignancy were identified. Alpha-fetoprotein and beta HCG tumor markers were in normal range on May 11, 2011.  Chest x-ray on May 10, 2011, was negative as well. Alpha-fetoprotein and beta hCG tumor markers were in normal range 08/08/2011 and 11/09/2011. Restaging CT scans of the chest, abdomen and pelvis 08/21/2011 showed no evidence of metastatic disease. There was stable to minimal improvement in left-sided lung nodules. There was no evidence of residual hepatic metastasis. New low-density left retroperitoneal "lesions" were favored to represent postoperative lymphoceles or seromas . Necrotic nodal metastasis was felt to be less likely. A  previously noted necrotic "lesion" at the precaval space of the right retroperitoneum had resolved or been resected. AFP and beta hCG tumor markers in normal range on 04/09/2012. 2. Obstruction of the proximal right ureter and moderate right hydronephrosis secondary to the large right retroperitoneal mass status post placement of double-J stent Mar 16, 2010. 3. Abdominal pain secondary to the right retroperitoneal mass, resolved. 4. History of rate related reaction to etoposide. 5. Elevated LDH, alpha-fetoprotein and beta HCG at the time of diagnosis May 2011. The tumor markers were normal on December 28, 2010, February 09, 2011, May 11, 2011, 08/08/2011, 11/09/2011, 01/09/2012 and 04/09/2012. 6. History of intermittent dysuria, resolved. 7. History of "hemorrhoids." Hemorrhoids were noted on the colonoscopy 07/19/2011. 8. History of chills and sweats following bleomycin. 9. History of thrombocytopenia secondary to chemotherapy. 10. 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 11. Mild microcytic anemia with labs confirming iron deficiency with a ferritin of 5 on 05/11/2011. Stool Hemoccult was negative x3 on June 06, 2011. A urinalysis was negative for blood on May 23, 2011. He underwent a colonoscopy by Dr. Christella Hartigan on 07/19/2011 with findings of small external hemorrhoids with an otherwise normal examination. Stool Hemoccult negative on 08/17/2011. The hemoglobin has corrected to normal range. He continues to have a red cell microcytosis. 12. 2 week history of intermittent "crampy" right-sided abdominal pain. He will contact the office with persistent/worsening abdominal pain.  Disposition-[name] appears stable. We will followup on the tumor markers from today. He will return for a followup visit, tumor markers and a chest x-ray in 3 months. He will contact the office in the interim as outlined above or with any other  problems.  Patient seen with Dr. Truett Perna.  Lonna Cobb ANP/GNP-BC

## 2012-07-10 NOTE — Telephone Encounter (Signed)
Pt also aware of cxr same day

## 2012-07-13 LAB — BETA HCG QUANT (REF LAB): Beta hCG, Tumor Marker: <0.5 m[IU]/mL (ref <5.0)

## 2012-07-13 LAB — AFP TUMOR MARKER: AFP-Tumor Marker: 2.1 ng/mL (ref 0.0–8.0)

## 2012-07-23 ENCOUNTER — Telehealth: Payer: Self-pay | Admitting: *Deleted

## 2012-07-23 NOTE — Telephone Encounter (Signed)
Message copied by Caren Griffins on Tue Jul 23, 2012  6:47 PM ------      Message from: Caleb Popp      Created: Tue Jul 23, 2012  6:44 PM                   ----- Message -----         From: Ladene Artist, MD         Sent: 07/16/2012  11:15 PM           To: Wandalee Ferdinand, RN, Glori Lucia, RN, #            Please call patient, tumor markers are negative, f/u as scheduled

## 2012-07-23 NOTE — Telephone Encounter (Signed)
Pt aware of normal tumor marker and to f/u as scheduled

## 2012-10-07 ENCOUNTER — Ambulatory Visit (HOSPITAL_BASED_OUTPATIENT_CLINIC_OR_DEPARTMENT_OTHER): Payer: Self-pay | Admitting: Oncology

## 2012-10-07 ENCOUNTER — Other Ambulatory Visit (HOSPITAL_BASED_OUTPATIENT_CLINIC_OR_DEPARTMENT_OTHER): Payer: Self-pay

## 2012-10-07 ENCOUNTER — Ambulatory Visit (HOSPITAL_COMMUNITY)
Admission: RE | Admit: 2012-10-07 | Discharge: 2012-10-07 | Disposition: A | Discharge status: Home / Self Care | Payer: Self-pay | Source: Ambulatory Visit | Attending: Nurse Practitioner | Admitting: Nurse Practitioner

## 2012-10-07 ENCOUNTER — Telehealth: Payer: Self-pay | Admitting: Oncology

## 2012-10-07 VITALS — BP 113/65 | HR 70 | Temp 96.9°F | Resp 20 | Ht 67.0 in | Wt 218.9 lb

## 2012-10-07 DIAGNOSIS — C6290 Malignant neoplasm of unspecified testis, unspecified whether descended or undescended: Secondary | ICD-10-CM

## 2012-10-07 NOTE — Telephone Encounter (Signed)
gv and printed pt appt schedule for FEB 2014

## 2012-10-07 NOTE — Progress Notes (Signed)
[facility]    OFFICE PROGRESS NOTE   INTERVAL HISTORY:   He returns as scheduled. He feels well. He is working. No pain. He reports episodes of "leg weakness "lasting for approximately 5 minutes. No difficulty with bowel or bladder control. No significant back pain.  Objective:  Vital signs in last 24 hours:  Blood pressure 113/65, pulse 70, temperature 96.9 F (36.1 C), temperature source Oral, resp. rate 20, height 5\' 7"  (1.702 m), weight 218 lb 14.4 oz (99.292 kg).    HEENT: Neck without mass Lymphatics: No cervical, supraclavicular, axillary, or inguinal nodes Resp: Lungs clear bilaterally Cardio: Regular rate and rhythm GI: No hepatosplenomegaly, no mass, nontender Vascular: No leg edema Neuro: The motor exam is intact in the legs and feet bilaterally  GU: Left testicle without mass     Lab Results:  AFP on 07/10/2012-2.1 Beta hCG tumor marker on 07/10/2012-less than 0.5  X-rays: Chest x-ray on 10/07/2012-normal  Medications: I have reviewed the patient's current medications.  Assessment/Plan: 1. 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 Staging CT scan showed a large right retroperitoneal mass, scattered tiny bilateral pulmonary parenchymal nodules and a 1-cm subtle lesion in the posterior right liver. Brain CT was negative for evidence of metastatic disease. He completed 4 cycles of BEP chemotherapy with the last cycle given June 14, 2010. The alpha-fetoprotein and beta HCG tumor markers were normal on July 10, 2010. Restaging CTs of the chest, abdomen and pelvis on September 09, 2010, revealed resolution of nodular densities in the right lung, a decrease in the size of liver lesions and a further decrease in the size of the right retroperitoneal mass. New inflammatory changes were noted in the  right lung. He underwent a right hepatectomy and retroperitoneal lymph node dissection at [facility] on November 15, 2010. The pathology confirmed no evidence of malignancy in the liver or retroperitoneal lymph nodes. The precaval mass contained mature teratoma and no immature elements or malignancy were identified. Alpha-fetoprotein and beta HCG tumor markers were in normal range on May 11, 2011. Chest x-ray on May 10, 2011, was negative as well. Alpha-fetoprotein and beta hCG tumor markers were in normal range 08/08/2011 and 11/09/2011. Restaging CT scans of the chest, abdomen and pelvis 08/21/2011 showed no evidence of metastatic disease. There was stable to minimal improvement in left-sided lung nodules. There was no evidence of residual hepatic metastasis. New low-density left retroperitoneal "lesions" were favored to represent postoperative lymphoceles or seromas . Necrotic nodal metastasis was felt to be less likely. A previously noted necrotic "lesion" at the precaval space of the right retroperitoneum had resolved or been resected. AFP and beta hCG tumor markers in normal range on 07/02/2012 2. Obstruction of the proximal right ureter and moderate right hydronephrosis secondary to the large right retroperitoneal mass status post placement of double-J stent Mar 16, 2010. 3. Abdominal pain secondary to the right retroperitoneal mass, resolved. 4. History of rate related reaction to etoposide. 5. Elevated LDH, alpha-fetoprotein and beta HCG at the time of diagnosis May 2011. The tumor markers were normal on December 28, 2010, February 09, 2011, May 11, 2011, 08/08/2011, 11/09/2011, 01/09/2012 and 04/09/2012. 6. History of intermittent dysuria, resolved. 7. History of "hemorrhoids." Hemorrhoids were noted on the colonoscopy 07/19/2011. 8. History of chills and sweats following bleomycin. 9. History of thrombocytopenia secondary to chemotherapy. 10. History of numbness  and tingling in the fingers and toes, likely  a manifestation of cisplatin neuropathy, resolved.       11. Mild microcytic anemia with labs confirming iron deficiency with a ferritin of 5 on 05/11/2011. Stool Hemoccult was negative x3 on June 06, 2011. A urinalysis was negative for blood on May 23, 2011. He underwent a colonoscopy by Dr. Christella Hartigan on 07/19/2011 with findings of small external hemorrhoids with an otherwise normal examination. Stool Hemoccult negative on 08/17/2011. The hemoglobin corrected into the normal range.      Disposition:  He remains in clinical remission from the testicular cancer. He will return for an office and lab visit in 3 months. We will check another chest x-ray when he returns in 6 months.  He will contact us for recurrent leg weakness or new symptoms. I have a low clinical suspicion for recurrent testicular cancer.   Keith Papas, MD  10/07/2012  5:42 PM

## 2012-10-07 NOTE — Progress Notes (Signed)
Patient presents today with his wife and two children with interpreter for visit. CXR normal

## 2012-10-11 LAB — BETA HCG QUANT (REF LAB): Beta hCG, Tumor Marker: <0.5 m[IU]/mL (ref <5.0)

## 2012-10-18 ENCOUNTER — Telehealth: Payer: Self-pay | Admitting: *Deleted

## 2012-10-18 NOTE — Telephone Encounter (Signed)
Made patient aware that tumor markers were negative.

## 2012-10-18 NOTE — Telephone Encounter (Signed)
Message copied by Wandalee Ferdinand on Fri Oct 18, 2012  1:16 PM ------      Message from: Ladene Artist      Created: Tue Oct 15, 2012  8:19 AM       Please call patient, tumor markers are negative, f/u as scheduled

## 2013-01-03 ENCOUNTER — Telehealth: Payer: Self-pay | Admitting: Oncology

## 2013-01-03 NOTE — Telephone Encounter (Signed)
returned pt called and advised on 2.25.14 appt...pt ok

## 2013-01-06 ENCOUNTER — Other Ambulatory Visit (HOSPITAL_BASED_OUTPATIENT_CLINIC_OR_DEPARTMENT_OTHER): Payer: Self-pay

## 2013-01-06 ENCOUNTER — Telehealth: Payer: Self-pay | Admitting: Oncology

## 2013-01-06 ENCOUNTER — Ambulatory Visit (HOSPITAL_BASED_OUTPATIENT_CLINIC_OR_DEPARTMENT_OTHER): Payer: Self-pay | Admitting: Nurse Practitioner

## 2013-01-06 VITALS — BP 109/64 | HR 75 | Temp 97.3°F | Resp 20 | Ht 67.0 in | Wt 214.1 lb

## 2013-01-06 DIAGNOSIS — C6290 Malignant neoplasm of unspecified testis, unspecified whether descended or undescended: Secondary | ICD-10-CM

## 2013-01-06 NOTE — Telephone Encounter (Signed)
gv and printed appt schedule for pt for Aug..pt aware to go to radiology b4 visi

## 2013-01-06 NOTE — Progress Notes (Signed)
OFFICE PROGRESS NOTE  Interval history:  Keith Price returns as scheduled. He overall feels well. He has a good appetite and good energy level. He denies abdominal pain. No shortness of breath or cough. No bowel or bladder problems. No fevers or sweats. Only complaint is a 3 day history of left-sided sore throat discomfort and nasal congestion.   Objective: Blood pressure 109/64, pulse 75, temperature 97.3 F (36.3 C), temperature source Oral, resp. rate 20, height 5\' 7"  (1.702 m), weight 214 lb 1.6 oz (97.115 kg).  Oropharynx is without thrush or ulceration. Posterior pharynx is without erythema or exudate. Tonsils with normal appearance. No palpable cervical, supraclavicular, axillary or inguinal lymph nodes. Lungs are clear. Regular cardiac rhythm. Abdomen is soft and nontender. No organomegaly. No mass. Extremities are without edema.  Lab Results: Lab Results  Component Value Date   WBC 5.4 07/10/2012   HGB 15.7 07/10/2012   HCT 47.3 07/10/2012   MCV 82.8 07/10/2012   PLT 157 07/10/2012    Chemistry:    Chemistry      Component Value Date/Time   NA 139 08/08/2011 1434   K 3.9 08/08/2011 1434   CL 107 08/08/2011 1434   CO2 28 08/08/2011 1434   BUN 20 08/08/2011 1434   CREATININE 0.96 08/08/2011 1434      Component Value Date/Time   CALCIUM 8.7 08/08/2011 1434   ALKPHOS 107 08/08/2011 1434   AST 18 08/08/2011 1434   ALT 20 08/08/2011 1434   BILITOT 0.6 08/08/2011 1434       Studies/Results: No results found.  Medications: I have reviewed the patient's current medications.  Assessment/Plan:  1. 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 Staging CT scan showed a large right retroperitoneal mass, scattered tiny bilateral pulmonary parenchymal nodules and a 1-cm subtle lesion in the posterior right liver. Brain CT was  negative for evidence of metastatic disease. He completed 4 cycles of BEP chemotherapy with the last cycle given June 14, 2010. The alpha-fetoprotein and beta HCG tumor markers were normal on July 10, 2010. Restaging CTs of the chest, abdomen and pelvis on September 09, 2010, revealed resolution of nodular densities in the right lung, a decrease in the size of liver lesions and a further decrease in the size of the right retroperitoneal mass. New inflammatory changes were noted in the right lung. He underwent a right hepatectomy and retroperitoneal lymph node dissection at [facility] on November 15, 2010. The pathology confirmed no evidence of malignancy in the liver or retroperitoneal lymph nodes. The precaval mass contained mature teratoma and no immature elements or malignancy were identified. Alpha-fetoprotein and beta HCG tumor markers were in normal range on May 11, 2011. Chest x-ray on May 10, 2011, was negative as well. Alpha-fetoprotein and beta hCG tumor markers were in normal range 08/08/2011 and 11/09/2011. Restaging CT scans of the chest, abdomen and pelvis 08/21/2011 showed no evidence of metastatic disease. There was stable to minimal improvement in left-sided lung nodules. There was no evidence of residual hepatic metastasis. New low-density left retroperitoneal "lesions" were favored to represent postoperative lymphoceles or seromas . Necrotic nodal metastasis was felt to be less likely. A previously noted necrotic "lesion" at the precaval space of the right retroperitoneum had resolved or been resected. AFP and beta hCG tumor markers in normal range on 07/02/2012 and 10/07/2012. 2. Obstruction of the proximal right ureter  and moderate right hydronephrosis secondary to the large right retroperitoneal mass status post placement of double-J stent Mar 16, 2010. 3. Abdominal pain secondary to the right retroperitoneal mass, resolved. 4. History of rate related reaction to etoposide. 5. Elevated LDH,  alpha-fetoprotein and beta HCG at the time of diagnosis May 2011. The tumor markers were normal on December 28, 2010, February 09, 2011, May 11, 2011, 08/08/2011, 11/09/2011, 01/09/2012,  04/09/2012, 07/02/2012, 10/07/2012. 6. History of intermittent dysuria, resolved. 7. History of "hemorrhoids." Hemorrhoids were noted on the colonoscopy 07/19/2011. 8. History of chills and sweats following bleomycin. 9. History of thrombocytopenia secondary to chemotherapy. 10. 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 11. Mild microcytic anemia with labs confirming iron deficiency with a ferritin of 5 on 05/11/2011. Stool Hemoccult was negative x3 on 06/06/2011. Urinalysis was negative for blood on 05/23/2011. Colonoscopy by Keith Price on 07/19/2011 showed findings of small external hemorrhoids with an otherwise normal examination. Stool Hemoccult negative on 08/17/2011. The hemoglobin corrected into normal range.  Disposition-Keith Price remains in clinical remission from the testicular cancer. We will followup on the tumor markers from today. He will return for labs, chest x-ray and an office visit in 6 months. He will contact the office the in interim with any problems.  Plan reviewed with Keith Price.     Keith Price ANP/GNP-BC

## 2013-01-13 ENCOUNTER — Telehealth: Payer: Self-pay | Admitting: *Deleted

## 2013-01-13 NOTE — Telephone Encounter (Signed)
Spoke with pt; per Dr. Truett Perna informed tumor markers are normal.  Pt verbalized understanding and confirmed appt for 07/07/13

## 2013-01-13 NOTE — Telephone Encounter (Signed)
Message copied by Gala Romney on Mon Jan 13, 2013  2:32 PM ------      Message from: Ladene Artist      Created: Sat Jan 11, 2013 12:51 PM       Please call patient, tumor markers are normal ------

## 2013-05-13 NOTE — Progress Notes (Signed)
This encounter was created in error - please disregard.

## 2013-07-07 ENCOUNTER — Ambulatory Visit (HOSPITAL_BASED_OUTPATIENT_CLINIC_OR_DEPARTMENT_OTHER): Payer: Self-pay | Admitting: Oncology

## 2013-07-07 ENCOUNTER — Telehealth: Payer: Self-pay | Admitting: Oncology

## 2013-07-07 ENCOUNTER — Ambulatory Visit (HOSPITAL_COMMUNITY)
Admission: RE | Admit: 2013-07-07 | Discharge: 2013-07-07 | Disposition: A | Discharge status: Home / Self Care | Payer: Self-pay | Source: Ambulatory Visit | Attending: Nurse Practitioner | Admitting: Nurse Practitioner

## 2013-07-07 ENCOUNTER — Ambulatory Visit (HOSPITAL_BASED_OUTPATIENT_CLINIC_OR_DEPARTMENT_OTHER): Payer: Self-pay | Admitting: Lab

## 2013-07-07 VITALS — BP 111/60 | HR 61 | Temp 98.1°F | Resp 18 | Ht 67.0 in | Wt 208.5 lb

## 2013-07-07 DIAGNOSIS — C6290 Malignant neoplasm of unspecified testis, unspecified whether descended or undescended: Secondary | ICD-10-CM

## 2013-07-07 DIAGNOSIS — M549 Dorsalgia, unspecified: Secondary | ICD-10-CM

## 2013-07-07 LAB — CBC WITH DIFFERENTIAL/PLATELET
BASO%: 0.5 % (ref 0.0–2.0)
EOS%: 1 % (ref 0.0–7.0)
HCT: 48.4 % (ref 38.4–49.9)
LYMPH%: 24.6 % (ref 14.0–49.0)
MCH: 29.2 pg (ref 27.2–33.4)
MCHC: 33.6 g/dL (ref 32.0–36.0)
MCV: 87.2 fL (ref 79.3–98.0)
MONO%: 7.4 % (ref 0.0–14.0)
NEUT%: 66.5 % (ref 39.0–75.0)
Platelets: 159 10*3/uL (ref 140–400)

## 2013-07-07 NOTE — Progress Notes (Signed)
[facility]    OFFICE PROGRESS NOTE   INTERVAL HISTORY:   He returns as scheduled. He feels well. He is working. No abdominal pain. He reports numbness over the right lateral abdominal wall. Intermittent pain at the right lower back-this has been a chronic problem.he reports numbness at the right scrotum. No other complaint.  Objective:  Vital signs in last 24 hours:  There were no vitals taken for this visit.    HEENT: neck without mass Lymphatics: no cervical, supraclavicular, axillary, or inguinal nodes Resp: lungs clear bilaterally Cardio: regular rate and rhythm GI: no hepatosplenomegaly, nontender, no mass Vascular: no leg edema GU: Left testis without mass  Musculoskeletal: Examination of the right flank is unremarkable     Lab Results:  Lab Results  Component Value Date   WBC 6.9 07/07/2013   HGB 16.3 07/07/2013   HCT 48.4 07/07/2013   MCV 87.2 07/07/2013   PLT 159 07/07/2013  ANC 4.6  Chest x-ray: Chest x-ray 07/07/2013, compared to 10/11/2012-no active cardio pulmonary disease  Medications: I have reviewed the patient's current medications.  Assessment/Plan: 1. 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 Staging CT scan showed a large right retroperitoneal mass, scattered tiny bilateral pulmonary parenchymal nodules and a 1-cm subtle lesion in the posterior right liver. Brain CT was negative for evidence of metastatic disease. He completed 4 cycles of BEP chemotherapy with the last cycle given June 14, 2010. The alpha-fetoprotein and beta HCG tumor markers were normal on July 10, 2010. Restaging CTs of the chest, abdomen and pelvis on September 09, 2010, revealed resolution of nodular densities in the right lung, a decrease in the size of liver lesions and a further decrease in the size of the  right retroperitoneal mass. New inflammatory changes were noted in the right lung. He underwent a right hepatectomy and retroperitoneal lymph node dissection at [facility] on November 15, 2010. The pathology confirmed no evidence of malignancy in the liver or retroperitoneal lymph nodes. The precaval mass contained mature teratoma and no immature elements or malignancy were identified. Alpha-fetoprotein and beta HCG tumor markers were in normal range on May 11, 2011. Chest x-ray on May 10, 2011, was negative as well. Alpha-fetoprotein and beta hCG tumor markers were in normal range 08/08/2011 and 11/09/2011. Restaging CT scans of the chest, abdomen and pelvis 08/21/2011 showed no evidence of metastatic disease. There was stable to minimal improvement in left-sided lung nodules. There was no evidence of residual hepatic metastasis. New low-density left retroperitoneal "lesions" were favored to represent postoperative lymphoceles or seromas . Necrotic nodal metastasis was felt to be less likely. A previously noted necrotic "lesion" at the precaval space of the right retroperitoneum had resolved or been resected. AFP and beta hCG tumor markers in normal range on 01/06/2013 2. Obstruction of the proximal right ureter and moderate right hydronephrosis secondary to the large right retroperitoneal mass status post placement of double-J stent Mar 16, 2010. 3. Abdominal pain secondary to the right retroperitoneal mass, resolved. 4. History of rate related reaction to etoposide. 5. Elevated LDH, alpha-fetoprotein and beta HCG at the time of diagnosis May 2011. The tumor markers were normal on December 28, 2010, February 09, 2011, May 11, 2011, 08/08/2011, 11/09/2011, 01/09/2012, 04/09/2012, 07/02/2012, 10/07/2012,and 01/06/2013 6. History of intermittent dysuria, resolved. 7. History of "hemorrhoids." Hemorrhoids were noted on the colonoscopy 07/19/2011. 8. History  of chills and sweats following bleomycin. 9. History of  thrombocytopenia secondary to chemotherapy. 10. 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 11. Mild microcytic anemia with labs confirming iron deficiency with a ferritin of 5 on 05/11/2011. Stool Hemoccult was negative x3 on 06/06/2011. Urinalysis was negative for blood on 05/23/2011. Colonoscopy by Dr. Christella Hartigan on 07/19/2011 showed findings of small external hemorrhoids with an otherwise normal examination. Stool Hemoccult negative on 08/17/2011. The hemoglobin corrected into normal range.   Disposition:  He remains in clinical remission from testicular cancer. He will return for an office and lab visit in 6 month. He will contact us in the interim for new symptoms. We will followup on the AFP and beta hCG from today.   Thornton Papas, MD  07/07/2013  9:16 PM

## 2013-07-07 NOTE — Telephone Encounter (Signed)
Gave pt appt for lab and MD on February 2014

## 2013-07-08 ENCOUNTER — Encounter: Payer: Self-pay | Admitting: *Deleted

## 2013-07-08 NOTE — Progress Notes (Unsigned)
On 07/07/2013 vital signs taken and date mistakenly reads 04/06/2014.  [place] rn

## 2013-07-15 ENCOUNTER — Telehealth: Payer: Self-pay | Admitting: *Deleted

## 2013-07-15 NOTE — Telephone Encounter (Signed)
Called pt with lab results. Tumor markers are normal, he voiced understanding.

## 2013-07-15 NOTE — Telephone Encounter (Signed)
Message copied by Caleb Popp on Tue Jul 15, 2013  1:19 PM ------      Message from: Thornton Papas B      Created: Mon Jul 14, 2013  6:17 PM       Please call patient, tumor markers are normal, f/u as scheduled ------

## 2014-01-08 ENCOUNTER — Ambulatory Visit (HOSPITAL_BASED_OUTPATIENT_CLINIC_OR_DEPARTMENT_OTHER): Payer: Self-pay | Admitting: Nurse Practitioner

## 2014-01-08 ENCOUNTER — Encounter: Payer: Self-pay | Admitting: Oncology

## 2014-01-08 ENCOUNTER — Other Ambulatory Visit: Payer: Self-pay

## 2014-01-08 ENCOUNTER — Telehealth: Payer: Self-pay | Admitting: Oncology

## 2014-01-08 VITALS — BP 113/60 | HR 62 | Temp 98.0°F | Resp 18 | Ht 67.0 in | Wt 206.2 lb

## 2014-01-08 DIAGNOSIS — C6290 Malignant neoplasm of unspecified testis, unspecified whether descended or undescended: Secondary | ICD-10-CM

## 2014-01-08 DIAGNOSIS — D509 Iron deficiency anemia, unspecified: Secondary | ICD-10-CM

## 2014-01-08 NOTE — Progress Notes (Signed)
OFFICE PROGRESS NOTE  Interval history:  [name] returns as scheduled. He feels well. No interim illnesses or infections aside from a recent minor cold. He denies pain. He has a good appetite. No nausea or vomiting. No constipation or diarrhea. He denies shortness of breath. No cough. He denies any bleeding.   Objective: Filed Vitals:   07/07/13 1017  BP: 111/60  Pulse: 61  Temp: 98.1 F (36.7 C)  Resp: 18   Oropharynx is without thrush or ulceration. No palpable cervical, supraclavicular, axillary or inguinal lymph nodes. Lungs are clear. No wheezes or rales. Regular cardiac rhythm. Abdomen soft and nontender. No organomegaly. No mass. No leg edema. No mass left testis.   Lab Results: Lab Results  Component Value Date   WBC 6.9 07/07/2013   HGB 16.3 07/07/2013   HCT 48.4 07/07/2013   MCV 87.2 07/07/2013   PLT 159 07/07/2013   NEUTROABS 4.6 07/07/2013    Chemistry:    Chemistry      Component Value Date/Time   NA 139 08/08/2011 1434   K 3.9 08/08/2011 1434   CL 107 08/08/2011 1434   CO2 28 08/08/2011 1434   BUN 20 08/08/2011 1434   CREATININE 0.96 08/08/2011 1434      Component Value Date/Time   CALCIUM 8.7 08/08/2011 1434   ALKPHOS 107 08/08/2011 1434   AST 18 08/08/2011 1434   ALT 20 08/08/2011 1434   BILITOT 0.6 08/08/2011 1434       Studies/Results: No results found.  Medications: I have reviewed the patient's current medications.  Assessment/Plan: 1. 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 Staging CT scan showed a large right retroperitoneal mass, scattered tiny bilateral pulmonary parenchymal nodules and a 1-cm subtle lesion in the posterior right liver. Brain CT was negative for evidence of metastatic disease. He completed 4 cycles of BEP chemotherapy with the last cycle given June 14, 2010. The  alpha-fetoprotein and beta HCG tumor markers were normal on July 10, 2010. Restaging CTs of the chest, abdomen and pelvis on September 09, 2010, revealed resolution of nodular densities in the right lung, a decrease in the size of liver lesions and a further decrease in the size of the right retroperitoneal mass. New inflammatory changes were noted in the right lung. He underwent a right hepatectomy and retroperitoneal lymph node dissection at [facility] on November 15, 2010. The pathology confirmed no evidence of malignancy in the liver or retroperitoneal lymph nodes. The precaval mass contained mature teratoma and no immature elements or malignancy were identified. Alpha-fetoprotein and beta HCG tumor markers were in normal range on May 11, 2011. Chest x-ray on May 10, 2011, was negative as well. Alpha-fetoprotein and beta hCG tumor markers were in normal range 08/08/2011 and 11/09/2011. Restaging CT scans of the chest, abdomen and pelvis 08/21/2011 showed no evidence of metastatic disease. There was stable to minimal improvement in left-sided lung nodules. There was no evidence of residual hepatic metastasis. New low-density left retroperitoneal "lesions" were favored to represent postoperative lymphoceles or seromas . Necrotic nodal metastasis was felt to be less likely. A previously noted necrotic "lesion" at the precaval space of the right retroperitoneum had resolved or been resected. AFP and beta hCG tumor markers in normal range on 01/06/2013 2. Obstruction of the proximal right ureter and moderate right hydronephrosis secondary to the large right retroperitoneal mass status post placement  of double-J stent Mar 16, 2010. 3. Abdominal pain secondary to the right retroperitoneal mass, resolved. 4. History of rate related reaction to etoposide. 5. Elevated LDH, alpha-fetoprotein and beta HCG at the time of diagnosis May 2011. The tumor markers were normal on December 28, 2010, February 09, 2011, May 11, 2011,  08/08/2011, 11/09/2011, 01/09/2012, 04/09/2012, 07/02/2012, 10/07/2012,and 01/06/2013 6. History of intermittent dysuria, resolved. 7. History of "hemorrhoids." Hemorrhoids were noted on the colonoscopy 07/19/2011. 8. History of chills and sweats following bleomycin. 9. History of thrombocytopenia secondary to chemotherapy. 10. 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 11. Mild microcytic anemia with labs confirming iron deficiency with a ferritin of 5 on 05/11/2011. Stool Hemoccult was negative x3 on 06/06/2011. Urinalysis was negative for blood on 05/23/2011. Colonoscopy by Dr. Ardis Hughs on 07/19/2011 showed findings of small external hemorrhoids with an otherwise normal examination. Stool Hemoccult negative on 08/17/2011. The hemoglobin corrected into normal range.   Dispositon-he remains in clinical remission from testicular cancer. He will return for a chest x-ray, labs and office visit in 6 months. He will contact the office in the interim with any problems.  Plan reviewed with Dr. Benay Spice.   Ned Card ANP/GNP-BC

## 2014-01-08 NOTE — Telephone Encounter (Signed)
gv and printed aptp scehd and avs for pt for Aug..Marland KitchenMarland Kitchen

## 2014-01-08 NOTE — Progress Notes (Signed)
Patient said he can't apply for medicaid--he is not a citizen. He has set up pmts with billing for his balances. He is aware he is self pay until he gets some insurance.he said he has [place], but didn't have his card and said he needed to apply again for it????

## 2014-01-11 LAB — BETA HCG QUANT (REF LAB): Beta hCG, Tumor Marker: <2 m[IU]/mL (ref <5.0)

## 2014-01-11 LAB — AFP TUMOR MARKER: AFP-Tumor Marker: 2.5 ng/mL (ref 0.0–8.0)

## 2014-01-12 ENCOUNTER — Telehealth: Payer: Self-pay | Admitting: *Deleted

## 2014-01-12 NOTE — Telephone Encounter (Signed)
Called and informed patient of normal tumor markers.  Per Dr. Benay Spice.  Patient verbalized understanding.

## 2014-01-12 NOTE — Telephone Encounter (Signed)
Message copied by Norma Fredrickson on Mon Jan 12, 2014  9:40 AM ------      Message from: Keith Price      Created: Sun Jan 11, 2014  2:01 PM       Please call patient, tumor markers are normal ------

## 2014-05-27 ENCOUNTER — Ambulatory Visit (INDEPENDENT_AMBULATORY_CARE_PROVIDER_SITE_OTHER): Payer: Self-pay | Admitting: Family Medicine

## 2014-05-27 VITALS — BP 122/76 | HR 65 | Temp 97.7°F | Resp 16 | Ht 68.75 in | Wt 205.6 lb

## 2014-05-27 DIAGNOSIS — L600 Ingrowing nail: Secondary | ICD-10-CM

## 2014-05-27 NOTE — Progress Notes (Signed)
[facility] [address], [place] [address] [phone]  Date:  05/27/2014   Name:  Keith Price   DOB:  Feb 05, 1981   MRN:  [id number]  PCP:  No primary provider on file.    Chief Complaint: Toe Pain   History of Present Illness:  Keith Price is a 33 y.o. very pleasant male patient who presents with the following:  He is here today with pain and swelling of his right great toe.  He has had this problem off an on in the past, and it is worse over the last week.  He did not injure the toenail prior to onset of sx, but then his son did step on the toe a few days ago He has been told that his nail was ingrown in the past, thinks this is the issue now as well He does have testicular cancer but is otherwise healthy  Patient Active Problem List   Diagnosis Date Noted  . 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 ABDOMINAL MASS 03/16/2010    Past Medical History  Diagnosis Date  . Anemia   . Testicular cancer dx'd 03/16/10    stage IV    Past Surgical History  Procedure Laterality Date  . Orciectomy      right  . Cystoscopy      History  Substance Use Topics  . Smoking status: Never Smoker   . Smokeless tobacco: Never Used  . Alcohol Use: No    Family History  Problem Relation Age of Onset  . Diabetes Father   . Diabetes Sister   . Diabetes Brother     No Known Allergies  Medication list has been reviewed and updated.  No current outpatient prescriptions on file prior to visit.   No current facility-administered medications on file prior to visit.    Review of Systems:  As per HPI- otherwise negative.   Physical Examination: Filed Vitals:   05/27/14 1135  BP: 122/76  Pulse: 65  Temp: 97.7 F (36.5 C)  Resp: 16   Filed Vitals:   05/27/14 1135  Height: 5' 8.75" (1.746 m)  Weight: 205 lb 9.6 oz (93.26 kg)   Body mass index is 30.59 kg/(m^2). Ideal Body Weight: Weight in (lb) to have BMI = 25: 167.7   GEN:  WDWN, NAD, Non-toxic, Alert & Oriented x 3, looks well HEENT: Atraumatic, Normocephalic.  Ears and Nose: No external deformity. EXTR: No clubbing/cyanosis/edema NEURO: Normal gait.  PSYCH: Normally interactive. Conversant. Not depressed or anxious appearing.  Calm demeanor.  Right great toenail:  Ingrown and tender, with clear serous drainage  Does not appear infected   Assessment and Plan: Ingrown toenail  Nail removed as per Natividad Brood, PA-C. Follow-up as needed  Signed Lamar Blinks, MD

## 2014-05-27 NOTE — Patient Instructions (Signed)
UA DEL DEDO ENCARNADA (Ingrown Toenail) . Mantenga el rea limpia, seca y vendada por 24 horas. Marland Kitchen Despus de 24 horas, quite el vendaje externo y deje el amarillo Control and instrumentation engineer. . Empape el dedo del pie/el pie en el agua jabonosa tibia por 5-10 minutos, una vez diario por 5 das. Reaplique un vendaje nuevo despus de cada limpieza. . Contine empapa hasta que se cae la gaza amarilla. . Notifique la oficina si usted tiene el siguiente de los muestras de la infeccin: Hinchazn, enrojecimiento, calor, drenaje del pus, de la fiebre > 101.0 F

## 2014-05-27 NOTE — Progress Notes (Signed)
Procedure Note: Verbal consent obtained.  Digital block with 4cc 2% plain lidocaine.  Betadine prep.  Sterile technique employed.  RIGHT great toe nail lifted without difficulty and removed in total.  Proximal aspect of the nail bed explored revealing no nail remnants. Xeroform dressing applied.  Wound care discussed with patient. Pt tolerated very well.

## 2014-07-08 ENCOUNTER — Ambulatory Visit (HOSPITAL_COMMUNITY): Payer: Self-pay

## 2014-07-08 ENCOUNTER — Ambulatory Visit (HOSPITAL_COMMUNITY)
Admission: RE | Admit: 2014-07-08 | Discharge: 2014-07-08 | Disposition: A | Discharge status: Home / Self Care | Payer: Self-pay | Source: Ambulatory Visit | Attending: Nurse Practitioner | Admitting: Nurse Practitioner

## 2014-07-08 DIAGNOSIS — C6290 Malignant neoplasm of unspecified testis, unspecified whether descended or undescended: Secondary | ICD-10-CM | POA: Insufficient documentation

## 2014-07-09 ENCOUNTER — Ambulatory Visit (HOSPITAL_BASED_OUTPATIENT_CLINIC_OR_DEPARTMENT_OTHER): Payer: Self-pay | Admitting: Oncology

## 2014-07-09 ENCOUNTER — Telehealth: Payer: Self-pay | Admitting: Oncology

## 2014-07-09 ENCOUNTER — Ambulatory Visit (HOSPITAL_BASED_OUTPATIENT_CLINIC_OR_DEPARTMENT_OTHER): Payer: Self-pay

## 2014-07-09 VITALS — BP 114/64 | HR 56 | Temp 98.1°F | Resp 20 | Ht 68.75 in | Wt 210.6 lb

## 2014-07-09 DIAGNOSIS — C6290 Malignant neoplasm of unspecified testis, unspecified whether descended or undescended: Secondary | ICD-10-CM

## 2014-07-09 NOTE — Progress Notes (Signed)
[facility] OFFICE PROGRESS NOTE   Diagnosis: Testicular cancer  INTERVAL HISTORY:   She returns as scheduled. He feels well. No abdominal pain. No bleeding. No difficulty with urination. His only complaint is discomfort at the lateral aspect of the right knee.  Objective:  Vital signs in last 24 hours:  Blood pressure 114/64, pulse 56, temperature 98.1 F (36.7 C), temperature source Oral, resp. rate 20, height 5' 8.75" (1.746 m), weight 210 lb 9.6 oz (95.528 kg).    HEENT: Neck without mass Lymphatics: No cervical, supra-clavicular, axillary, or inguinal nodes Resp: Lungs clear bilaterally Cardio: Regular rate and rhythm GI: No hepatosplenomegaly, nontender, no mass Vascular: No leg edema GU: Left testis without mass,? Left inguinal hernia  Musculoskeletal: No pain with motion at the right knee. No effusion.    Lab Results: AFP and hCG  Imaging:  Dg Chest 2 View  07/08/2014   CLINICAL DATA:  Testicular cancer  EXAM: CHEST  2 VIEW  COMPARISON:  07/07/2013  FINDINGS: The heart size and mediastinal contours are within normal limits. Both lungs are clear. The visualized skeletal structures are unremarkable. Metal embolization coils overlying the liver unchanged.  IMPRESSION: No active cardiopulmonary disease.   Electronically Signed   By: Franchot Gallo M.D.   On: 07/08/2014 15:38    Medications: I have reviewed the patient's current medications.  Assessment/Plan: 1. 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 Staging CT scan showed a large right retroperitoneal mass, scattered tiny bilateral pulmonary parenchymal nodules and a 1-cm subtle lesion in the posterior right liver. Brain CT was negative for evidence of metastatic disease. He completed 4 cycles of BEP chemotherapy with the last cycle given June 14, 2010. The alpha-fetoprotein and beta HCG tumor markers were normal on July 10, 2010. Restaging CTs of the chest, abdomen and pelvis on September 09, 2010, revealed resolution of nodular densities in the right lung, a decrease in the size of liver lesions and a further decrease in the size of the right retroperitoneal mass. New inflammatory changes were noted in the right lung. He underwent a right hepatectomy and retroperitoneal lymph node dissection at [facility] on November 15, 2010. The pathology confirmed no evidence of malignancy in the liver or retroperitoneal lymph nodes. The precaval mass contained mature teratoma and no immature elements or malignancy were identified. Alpha-fetoprotein and beta HCG tumor markers were in normal range on May 11, 2011. Chest x-ray on May 10, 2011, was negative as well. Alpha-fetoprotein and beta hCG tumor markers were in normal range 08/08/2011 and 11/09/2011. Restaging CT scans of the chest, abdomen and pelvis 08/21/2011 showed no evidence of metastatic disease. There was stable to minimal improvement in left-sided lung nodules. There was no evidence of residual hepatic metastasis. New low-density left retroperitoneal "lesions" were favored to represent postoperative lymphoceles or seromas . Necrotic nodal metastasis was felt to be less likely. A previously noted necrotic "lesion" at the precaval space of the right retroperitoneum had resolved or been resected. AFP and beta hCG tumor markers in normal range on 01/08/2014 2. Obstruction of the proximal right ureter and moderate right hydronephrosis secondary to the large right retroperitoneal mass status post placement of double-J stent Mar 16, 2010. 3. Abdominal pain secondary to the right retroperitoneal mass, resolved. 4. History of rate related reaction to etoposide. 5. Elevated LDH, alpha-fetoprotein and beta HCG at the time  of diagnosis May 2011. The tumor markers were normal on December 28, 2010, February 09, 2011, May 11, 2011, 08/08/2011, 11/09/2011, 01/09/2012, 04/09/2012, 07/02/2012, 10/07/2012,and 01/07/2014 6. History of intermittent dysuria, resolved. 7. History of "hemorrhoids." Hemorrhoids were noted on the colonoscopy 07/19/2011. 8. History of chills and sweats following bleomycin. 9. History of thrombocytopenia secondary to chemotherapy. 10. 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 11. Mild microcytic anemia with labs confirming iron deficiency with a ferritin of 5 on 05/11/2011. Stool Hemoccult was negative x3 on 06/06/2011. Urinalysis was negative for blood on 05/23/2011. Colonoscopy by Dr. Ardis Hughs on 07/19/2011 showed findings of small external hemorrhoids with an otherwise normal examination. Stool Hemoccult negative on 08/17/2011. The hemoglobin corrected into normal range.   Disposition:  He remains in clinical remission from testicular cancer. We will followup on the serum tumor markers in today. He will return for an office and lab visit in 6 months. He will contact us in the interim for new symptoms.  Betsy Coder, MD  07/09/2014  10:25 AM

## 2014-07-09 NOTE — Telephone Encounter (Signed)
gv adn printed appt sched and avs for pt for Feb 2016 °

## 2014-07-11 LAB — AFP TUMOR MARKER-PREVIOUS METHOD: AFP TUMOR MARKER: 2.3 ng/mL (ref 0.0–8.0)

## 2014-07-11 LAB — AFP TUMOR MARKER: AFP TUMOR MARKER: 2.5 ng/mL (ref <6.1)

## 2014-07-11 LAB — BETA HCG QUANT (REF LAB)

## 2014-07-13 ENCOUNTER — Telehealth: Payer: Self-pay | Admitting: *Deleted

## 2014-07-13 NOTE — Telephone Encounter (Signed)
Called and informed patient of normal Tumor Markers.  Per Elby Showers. Thomas.  Patient verbalized understanding.

## 2015-01-01 ENCOUNTER — Telehealth: Payer: Self-pay | Admitting: Nurse Practitioner

## 2015-01-01 ENCOUNTER — Other Ambulatory Visit (HOSPITAL_BASED_OUTPATIENT_CLINIC_OR_DEPARTMENT_OTHER): Payer: Self-pay

## 2015-01-01 ENCOUNTER — Ambulatory Visit (HOSPITAL_BASED_OUTPATIENT_CLINIC_OR_DEPARTMENT_OTHER): Payer: Self-pay | Admitting: Nurse Practitioner

## 2015-01-01 ENCOUNTER — Encounter: Payer: Self-pay | Admitting: Nurse Practitioner

## 2015-01-01 VITALS — BP 108/59 | HR 62 | Temp 98.1°F | Resp 19 | Ht 68.75 in | Wt 212.9 lb

## 2015-01-01 DIAGNOSIS — C6291 Malignant neoplasm of right testis, unspecified whether descended or undescended: Secondary | ICD-10-CM

## 2015-01-01 DIAGNOSIS — R109 Unspecified abdominal pain: Secondary | ICD-10-CM

## 2015-01-01 DIAGNOSIS — C6290 Malignant neoplasm of unspecified testis, unspecified whether descended or undescended: Secondary | ICD-10-CM

## 2015-01-01 NOTE — Progress Notes (Signed)
[place] OFFICE PROGRESS NOTE   Diagnosis:  Testicular cancer  INTERVAL HISTORY:   [name] returns as scheduled. No interim illnesses or infections. He has a good appetite. He has occasional mild discomfort at the right upper lateral abdomen. The pain does not occur on a daily basis and resolves without intervention. He thinks the discomfort may be related to some heavy lifting he did approximately 1 month ago. No diarrhea or constipation. No nausea or vomiting. No fevers or sweats. No urinary symptoms. No shortness of breath or chest pain. No cough.  Objective:  Vital signs in last 24 hours:  Blood pressure 108/59, pulse 62, temperature 98.1 F (36.7 C), temperature source Oral, resp. rate 19, height 5' 8.75" (1.746 m), weight 212 lb 14.4 oz (96.571 kg), SpO2 100 %.    HEENT: No thrush or ulcers. Lymphatics: No palpable cervical, supraclavicular, axillary or inguinal lymph nodes. Resp: Lungs clear bilaterally. Cardio: Regular rate and rhythm. GI: Abdomen soft and nontender. No mass. No organomegaly. Vascular: No leg edema. GU: Left testis without mass.    Lab Results:  Lab Results  Component Value Date   WBC 6.9 07/07/2013   HGB 16.3 07/07/2013   HCT 48.4 07/07/2013   MCV 87.2 07/07/2013   PLT 159 07/07/2013   NEUTROABS 4.6 07/07/2013    Imaging:  No results found.  Medications: I have reviewed the patient's current medications.  Assessment/Plan: 1. 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 Staging CT scan showed a large right retroperitoneal mass, scattered tiny bilateral pulmonary parenchymal nodules and a 1-cm subtle lesion in the posterior right liver. Brain CT was negative for evidence of metastatic disease. He completed 4 cycles of BEP chemotherapy with the last cycle given June 14, 2010. The alpha-fetoprotein and beta HCG tumor markers were normal on July 10, 2010. Restaging CTs of the chest, abdomen and pelvis on September 09, 2010, revealed resolution of nodular densities in the right lung, a decrease in the size of liver lesions and a further decrease in the size of the right retroperitoneal mass. New inflammatory changes were noted in the right lung. He underwent a right hepatectomy and retroperitoneal lymph node dissection at [facility] on November 15, 2010. The pathology confirmed no evidence of malignancy in the liver or retroperitoneal lymph nodes. The precaval mass contained mature teratoma and no immature elements or malignancy were identified. Alpha-fetoprotein and beta HCG tumor markers were in normal range on May 11, 2011. Chest x-ray on May 10, 2011, was negative as well. Alpha-fetoprotein and beta hCG tumor markers were in normal range 08/08/2011 and 11/09/2011. Restaging CT scans of the chest, abdomen and pelvis 08/21/2011 showed no evidence of metastatic disease. There was stable to minimal improvement in left-sided lung nodules. There was no evidence of residual hepatic metastasis. New low-density left retroperitoneal "lesions" were favored to represent postoperative lymphoceles or seromas. Necrotic nodal metastasis was felt to be less likely. A previously noted necrotic "lesion" at the precaval space of the right retroperitoneum had resolved or been resected. AFP and beta hCG tumor markers in normal range on 07/09/2014 2. Obstruction of the proximal right ureter and moderate right hydronephrosis secondary to the large right retroperitoneal mass status post placement of double-J stent Mar 16, 2010. 3. Abdominal pain secondary to the right retroperitoneal mass, resolved. 4. History of rate related reaction to etoposide. 5. Elevated  LDH, alpha-fetoprotein and beta HCG at the time of diagnosis May 2011. The tumor markers were normal on December 28, 2010, February 09, 2011, May 11, 2011, 08/08/2011, 11/09/2011, 01/09/2012, 04/09/2012, 07/02/2012, 10/07/2012,and 01/07/2014 6. History of intermittent dysuria, resolved. 7. History of "hemorrhoids." Hemorrhoids were noted on the colonoscopy 07/19/2011. 8. History of chills and sweats following bleomycin. 9. History of thrombocytopenia secondary to chemotherapy. 10. 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 11. Mild microcytic anemia with labs confirming iron deficiency with a ferritin of 5 on 05/11/2011. Stool Hemoccult was negative x3 on 06/06/2011. Urinalysis was negative for blood on 05/23/2011. Colonoscopy by Dr. Ardis Hughs on 07/19/2011 showed findings of small external hemorrhoids with an otherwise normal examination. Stool Hemoccult negative on 08/17/2011. The hemoglobin corrected into normal range.   Disposition: [name] appears well. He remains in clinical remission from testicular cancer. We will follow-up on the serum tumor markers from today. We scheduled a return visit, labs and a chest x-ray in 6 months. He will contact the office in the interim with any problems. We specifically discussed increased right-sided abdominal pain.    Ned Card ANP/GNP-BC   01/01/2015  3:54 PM

## 2015-01-01 NOTE — Telephone Encounter (Signed)
Gave avs & calendar for August °

## 2015-01-05 LAB — AFP TUMOR MARKER-PREVIOUS METHOD: AFP Tumor Marker: 2.6 ng/mL (ref 0.0–8.0)

## 2015-01-05 LAB — BETA HCG QUANT (REF LAB)

## 2015-01-05 LAB — AFP TUMOR MARKER: AFP-Tumor Marker: 2.6 ng/mL (ref <6.1)

## 2015-01-06 ENCOUNTER — Telehealth: Payer: Self-pay

## 2015-01-06 NOTE — Telephone Encounter (Signed)
Called to inform pt of tumor marker results. No answer

## 2015-01-06 NOTE — Telephone Encounter (Signed)
Called and informed pt of normal tumor markers. Pt verbalized understanding and denies any questions or concerns at this time

## 2015-01-06 NOTE — Telephone Encounter (Signed)
-----   Message from Ladell Pier, MD sent at 01/05/2015  6:49 PM EST ----- Please call patient, tumor markers are negative

## 2015-07-01 ENCOUNTER — Ambulatory Visit (HOSPITAL_BASED_OUTPATIENT_CLINIC_OR_DEPARTMENT_OTHER): Payer: Self-pay | Admitting: Oncology

## 2015-07-01 ENCOUNTER — Telehealth: Payer: Self-pay | Admitting: Oncology

## 2015-07-01 ENCOUNTER — Ambulatory Visit: Payer: Self-pay

## 2015-07-01 ENCOUNTER — Telehealth: Payer: Self-pay | Admitting: *Deleted

## 2015-07-01 ENCOUNTER — Ambulatory Visit (HOSPITAL_COMMUNITY)
Admission: RE | Admit: 2015-07-01 | Discharge: 2015-07-01 | Disposition: A | Discharge status: Home / Self Care | Payer: Self-pay | Source: Ambulatory Visit | Attending: Nurse Practitioner | Admitting: Nurse Practitioner

## 2015-07-01 ENCOUNTER — Other Ambulatory Visit: Payer: Self-pay

## 2015-07-01 VITALS — BP 108/51 | HR 71 | Temp 98.1°F | Resp 18 | Ht 68.75 in | Wt 213.7 lb

## 2015-07-01 DIAGNOSIS — Z8547 Personal history of malignant neoplasm of testis: Secondary | ICD-10-CM

## 2015-07-01 DIAGNOSIS — C6291 Malignant neoplasm of right testis, unspecified whether descended or undescended: Secondary | ICD-10-CM

## 2015-07-01 DIAGNOSIS — C6290 Malignant neoplasm of unspecified testis, unspecified whether descended or undescended: Secondary | ICD-10-CM | POA: Insufficient documentation

## 2015-07-01 NOTE — Telephone Encounter (Signed)
PER LISA THOMAS,NP'S 01/01/15 DICTATION. PT. WILL BE SEEN IN SIX MONTHS WITH A CHEST X-RAY. INSTRUCTED PAM TO USE THE CHEST X-RAY ORDER DATED 01/01/15.

## 2015-07-01 NOTE — Telephone Encounter (Signed)
Gave adn printed appt sched and avs for pt for Feb 2017 °

## 2015-07-01 NOTE — Progress Notes (Signed)
[name] OFFICE PROGRESS NOTE   Diagnosis: Testicular cancer  INTERVAL HISTORY:   He returns as scheduled. He feels well. Good appetite and energy level. No dyspnea. No pain. No complaint.  Objective:  Vital signs in last 24 hours:  Blood pressure 108/51, pulse 71, temperature 98.1 F (36.7 C), temperature source Oral, resp. rate 18, height 5' 8.75" (1.746 m), weight 213 lb 11.2 oz (96.934 kg), SpO2 99 %.    HEENT: Neck without mass Lymphatics: No cervical, supra-clavicular, axillary, or inguinal nodes Resp: Lungs clear bilaterally Cardio: Regular rate and rhythm GI: No hepatomegaly, no spinal megaly, nontender, no mass Vascular: No leg edema GU: Left testis without mass      Medications: I have reviewed the patient's current medications.  Assessment/Plan: 1. 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 Staging CT scan showed a large right retroperitoneal mass, scattered tiny bilateral pulmonary parenchymal nodules and a 1-cm subtle lesion in the posterior right liver. Brain CT was negative for evidence of metastatic disease. He completed 4 cycles of BEP chemotherapy with the last cycle given June 14, 2010. The alpha-fetoprotein and beta HCG tumor markers were normal on July 10, 2010. Restaging CTs of the chest, abdomen and pelvis on September 09, 2010, revealed resolution of nodular densities in the right lung, a decrease in the size of liver lesions and a further decrease in the size of the right retroperitoneal mass. New inflammatory changes were noted in the right lung. He underwent a right hepatectomy and retroperitoneal lymph node dissection at [facility] on November 15, 2010. The pathology confirmed no evidence of malignancy in the liver or retroperitoneal lymph nodes. The precaval mass contained mature teratoma and  no immature elements or malignancy were identified. Alpha-fetoprotein and beta HCG tumor markers were in normal range on May 11, 2011. Chest x-ray on May 10, 2011, was negative as well. Alpha-fetoprotein and beta hCG tumor markers were in normal range 08/08/2011 and 11/09/2011. Restaging CT scans of the chest, abdomen and pelvis 08/21/2011 showed no evidence of metastatic disease. There was stable to minimal improvement in left-sided lung nodules. There was no evidence of residual hepatic metastasis. New low-density left retroperitoneal "lesions" were favored to represent postoperative lymphoceles or seromas. Necrotic nodal metastasis was felt to be less likely. A previously noted necrotic "lesion" at the precaval space of the right retroperitoneum had resolved or been resected. AFP and beta hCG tumor markers in normal range on 01/01/2015 2. Obstruction of the proximal right ureter and moderate right hydronephrosis secondary to the large right retroperitoneal mass status post placement of double-J stent Mar 16, 2010. 3. Abdominal pain secondary to the right retroperitoneal mass, resolved. 4. History of rate related reaction to etoposide. 5. Elevated LDH, alpha-fetoprotein and beta HCG at the time of diagnosis May 2011. The tumor markers were normal on December 28, 2010, February 09, 2011, May 11, 2011, 08/08/2011, 11/09/2011, 01/09/2012, 04/09/2012, 07/02/2012, 10/07/2012,and 01/07/2014 6. History of intermittent dysuria, resolved. 7. History of "hemorrhoids." Hemorrhoids were noted on the colonoscopy 07/19/2011. 8. History of chills and sweats following bleomycin. 9. History of thrombocytopenia secondary to chemotherapy. 10. 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 11. Mild microcytic anemia with labs confirming iron deficiency with a ferritin of 5 on 05/11/2011. Stool Hemoccult was negative x3 on 06/06/2011. Urinalysis was negative  for  blood on 05/23/2011. Colonoscopy by Dr. Ardis Hughs on 07/19/2011 showed findings of small external hemorrhoids with an otherwise normal examination. Stool Hemoccult negative on 08/17/2011. The hemoglobin corrected into normal range.    Disposition:  [name] remains in remission from testicular cancer. We will follow-up on the chest x-ray and serum tumor markers from today. He will return for an office visit in 6 months.  We will ask the Cancer center social worker to assist him in obtaining a primary physician.  Betsy Coder, MD  07/01/2015  2:56 PM

## 2015-07-04 LAB — AFP TUMOR MARKER: AFP-Tumor Marker: 2.8 ng/mL (ref <6.1)

## 2015-07-04 LAB — BETA HCG QUANT (REF LAB): Beta hCG, Tumor Marker: <2 m[IU]/mL

## 2015-07-06 ENCOUNTER — Encounter: Payer: Self-pay | Admitting: *Deleted

## 2015-07-06 NOTE — Progress Notes (Signed)
[place] Work  [employer] representative received referral from physician to assist patient in finding a PCP.  CSW attempted to contact patient to offer support and explore options.  Patients voicemail was not set up; therefore CSw was unable to leave a message.  CSW will continue attempting to contact patient.  Johnnye Lana, MSW, LCSW, OSW-C Clinical Social Worker [facility] [phone]

## 2015-07-07 ENCOUNTER — Encounter: Payer: Self-pay | Admitting: *Deleted

## 2015-07-07 NOTE — Progress Notes (Signed)
[place] Work  Clinical Social Work received return call from pt. CSW informed pt of options for PCP and provided him with several names and numbers; including [facility], [place]. He stated understanding and took down the information. CSW also relayed to pt that Dr. Gearldine Shown RN was trying to call him and to please call her as well. This CSW provided [facility] number to contact RN.   Clinical Social Work interventions: Resource education  Loren Racer, [place] Worker [place]  [place] Phone: [phone] Fax: [phone]

## 2015-12-31 ENCOUNTER — Other Ambulatory Visit (HOSPITAL_BASED_OUTPATIENT_CLINIC_OR_DEPARTMENT_OTHER): Payer: Self-pay

## 2015-12-31 ENCOUNTER — Ambulatory Visit (HOSPITAL_BASED_OUTPATIENT_CLINIC_OR_DEPARTMENT_OTHER): Payer: Self-pay | Admitting: Nurse Practitioner

## 2015-12-31 ENCOUNTER — Other Ambulatory Visit: Payer: Self-pay | Admitting: Nurse Practitioner

## 2015-12-31 VITALS — BP 113/55 | HR 70 | Temp 98.1°F | Resp 18 | Ht 68.75 in | Wt 213.6 lb

## 2015-12-31 DIAGNOSIS — C6291 Malignant neoplasm of right testis, unspecified whether descended or undescended: Secondary | ICD-10-CM

## 2015-12-31 DIAGNOSIS — Z8547 Personal history of malignant neoplasm of testis: Secondary | ICD-10-CM

## 2015-12-31 NOTE — Progress Notes (Addendum)
[place] OFFICE PROGRESS NOTE   Diagnosis:  Testicular cancer  INTERVAL HISTORY:   [name] returns as scheduled. About a month ago he began having headaches. The headache was present fairly continuously for about 2 weeks. He has not had a headache for the past 1-2 weeks. He indicates the pain is in the right parietal/occipital region. At its worst he rates the pain at a 7 on the pain scale. He tried Tylenol and aspirin with no improvement. No vision change. No nausea or vomiting. No falls or balance problems. He has noted occasional dizziness. No other pain. He denies fever. No shortness of breath. He notes occasional burning with urination.  Objective:  Vital signs in last 24 hours:  Blood pressure 113/55, pulse 70, temperature 98.1 F (36.7 C), temperature source Oral, resp. rate 18, height 5' 8.75" (1.746 m), weight 213 lb 9.6 oz (96.888 kg), SpO2 100 %.    HEENT: Pupils equal round and reactive to light. Extraocular movements intact. Sclera anicteric. Oropharynx without thrush or ulceration. Lymphatics: No palpable cervical, supra clavicular, axillary or inguinal lymph nodes. Resp: Lungs clear bilaterally. Cardio: Regular rate and rhythm. GI: Abdomen soft and nontender. No mass. No hepatomegaly. Vascular: No leg edema. Neuro: Alert and oriented. Motor strength 5 over 5. Knee DTRs 2+, symmetric. Finger to nose and rapid alternating movement intact. Heel-to-shin intact.  GU: Left testis without mass.   Lab Results:  Lab Results  Component Value Date   WBC 6.9 07/07/2013   HGB 16.3 07/07/2013   HCT 48.4 07/07/2013   MCV 87.2 07/07/2013   PLT 159 07/07/2013   NEUTROABS 4.6 07/07/2013    Imaging:  No results found.  Medications: I have reviewed the patient's current medications.  Assessment/Plan: 1. 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 Staging CT scan showed a large right retroperitoneal mass, scattered tiny bilateral pulmonary parenchymal nodules and a 1-cm subtle lesion in the posterior right liver. Brain CT was negative for evidence of metastatic disease. He completed 4 cycles of BEP chemotherapy with the last cycle given June 14, 2010. The alpha-fetoprotein and beta HCG tumor markers were normal on July 10, 2010. Restaging CTs of the chest, abdomen and pelvis on September 09, 2010, revealed resolution of nodular densities in the right lung, a decrease in the size of liver lesions and a further decrease in the size of the right retroperitoneal mass. New inflammatory changes were noted in the right lung. He underwent a right hepatectomy and retroperitoneal lymph node dissection at [facility] on November 15, 2010. The pathology confirmed no evidence of malignancy in the liver or retroperitoneal lymph nodes. The precaval mass contained mature teratoma and no immature elements or malignancy were identified. Alpha-fetoprotein and beta HCG tumor markers were in normal range on May 11, 2011. Chest x-ray on May 10, 2011, was negative as well. Alpha-fetoprotein and beta hCG tumor markers were in normal range 08/08/2011 and 11/09/2011. Restaging CT scans of the chest, abdomen and pelvis 08/21/2011 showed no evidence of metastatic disease. There was stable to minimal improvement in left-sided lung nodules. There was no evidence of residual hepatic metastasis. New low-density left retroperitoneal "lesions" were favored to represent postoperative lymphoceles or seromas. Necrotic nodal metastasis was felt to be less likely. A previously noted necrotic "lesion" at the precaval space of the right retroperitoneum had resolved or been resected. AFP and  beta hCG tumor markers in normal range on 01/01/2015 2. Obstruction of the proximal right ureter and moderate right hydronephrosis secondary to the large right  retroperitoneal mass status post placement of double-J stent Mar 16, 2010. 3. Abdominal pain secondary to the right retroperitoneal mass, resolved. 4. History of rate related reaction to etoposide. 5. Elevated LDH, alpha-fetoprotein and beta HCG at the time of diagnosis May 2011. The tumor markers were normal on December 28, 2010, February 09, 2011, May 11, 2011, 08/08/2011, 11/09/2011, 01/09/2012, 04/09/2012, 07/02/2012, 10/07/2012,and 01/07/2014 6. History of intermittent dysuria, resolved. 7. History of "hemorrhoids." Hemorrhoids were noted on the colonoscopy 07/19/2011. 8. History of chills and sweats following bleomycin. 9. History of thrombocytopenia secondary to chemotherapy. 10. 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 11. Mild microcytic anemia with labs confirming iron deficiency with a ferritin of 5 on 05/11/2011. Stool Hemoccult was negative x3 on 06/06/2011. Urinalysis was negative for blood on 05/23/2011. Colonoscopy by Dr. Ardis Hughs on 07/19/2011 showed findings of small external hemorrhoids with an otherwise normal examination. Stool Hemoccult negative on 08/17/2011. The hemoglobin corrected into normal range.   Disposition: [name] appears well. He remains in clinical remission from testicular cancer. We will follow-up on the tumor markers from today. We scheduled a return visit with labs and a chest x-ray in 6 months.  With regard to the headaches, he has been headache free for about 2 weeks. He understands to contact the office if the headaches recur or he develops of the other worrisome symptoms. We specifically discussed visual disturbance, nausea/vomiting, falls, balance problems. He expressed his understanding.  Patient seen with Dr. Benay Spice. 25 minutes were spent face-to-face at today's visit with the majority of that time involved in counseling/coordination of care.    Ned Card ANP/GNP-BC     12/31/2015  11:19 AM  This was a shared visit with Ned Card. He remains in clinical remission from testicular cancer. [name] will contact us for recurrent headaches.  Julieanne Manson, M.D.

## 2016-01-01 LAB — BETA HCG QUANT (REF LAB)

## 2016-01-01 LAB — AFP TUMOR MARKER: AFP, SERUM, TUMOR MARKER: 3.4 ng/mL (ref 0.0–8.3)

## 2016-01-03 ENCOUNTER — Telehealth: Payer: Self-pay | Admitting: *Deleted

## 2016-01-03 NOTE — Telephone Encounter (Signed)
-----   Message from Ladell Pier, MD sent at 01/01/2016  9:14 AM EST ----- Please call patient, tumor markers are normal

## 2016-01-03 NOTE — Telephone Encounter (Signed)
As noted below by Dr. Benay Spice, I informed patient of his lab result. Patient verbalized understanding.

## 2016-01-04 ENCOUNTER — Telehealth: Payer: Self-pay | Admitting: Oncology

## 2016-01-04 LAB — ALPHA FETO PROTEIN (PARALLEL TESTING): AFP-Tumor Marker: 2.7 ng/mL (ref <6.1)

## 2016-01-04 LAB — BETA HCG, QN (PARALLEL TESTING)

## 2016-01-04 NOTE — Telephone Encounter (Signed)
Not able to reach patient by phone or leave message. August appointments mailed, including referral for cxr to be done same day as lab/fu visit 06/29/16.

## 2016-06-29 ENCOUNTER — Ambulatory Visit (HOSPITAL_BASED_OUTPATIENT_CLINIC_OR_DEPARTMENT_OTHER): Payer: Self-pay | Admitting: Oncology

## 2016-06-29 ENCOUNTER — Other Ambulatory Visit: Payer: Self-pay

## 2016-06-29 ENCOUNTER — Telehealth: Payer: Self-pay | Admitting: Oncology

## 2016-06-29 VITALS — BP 125/58 | HR 73 | Temp 98.5°F | Resp 18 | Ht 68.75 in | Wt 210.7 lb

## 2016-06-29 DIAGNOSIS — Z8547 Personal history of malignant neoplasm of testis: Secondary | ICD-10-CM

## 2016-06-29 DIAGNOSIS — C6291 Malignant neoplasm of right testis, unspecified whether descended or undescended: Secondary | ICD-10-CM

## 2016-06-29 DIAGNOSIS — R1031 Right lower quadrant pain: Secondary | ICD-10-CM

## 2016-06-29 NOTE — Telephone Encounter (Signed)
Gv pt appt for 03/01/17

## 2016-06-29 NOTE — Progress Notes (Signed)
[place] OFFICE PROGRESS NOTE   Diagnosis: Testicular cancer  INTERVAL HISTORY:   [name] returns as scheduled. He feels well. He is working. He reports intermittent "squeezing "pain in the right upper abdomen for the past month. The pain occurs 2-3 times per week and lasts for 20-40 minutes. He has pain in the right lower abdomen after heavy lifting. No nausea. No rectal bleeding.  Objective:  Vital signs in last 24 hours:  Blood pressure (!) 125/58, pulse 73, temperature 98.5 F (36.9 C), temperature source Oral, resp. rate 18, height 5' 8.75" (1.746 m), weight 210 lb 11.2 oz (95.6 kg), SpO2 99 %.    HEENT: Neck without mass Lymphatics: No cervical, supraclavicular, axillary, or inguinal nodes Resp: Lungs clear bilaterally Cardio: Regular rate and rhythm GI: No mass, no hepatosplenomegaly, nontender GU: Left testis without mass  Medications: I have reviewed the patient's current medications.  Assessment/Plan: 1. 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 Staging CT scan showed a large right retroperitoneal mass, scattered tiny bilateral pulmonary parenchymal nodules and a 1-cm subtle lesion in the posterior right liver. Brain CT was negative for evidence of metastatic disease. He completed 4 cycles of BEP chemotherapy with the last cycle given June 14, 2010. The alpha-fetoprotein and beta HCG tumor markers were normal on July 10, 2010. Restaging CTs of the chest, abdomen and pelvis on September 09, 2010, revealed resolution of nodular densities in the right lung, a decrease in the size of liver lesions and a further decrease in the size of the right retroperitoneal mass. New inflammatory changes were noted in the right lung. He underwent a right hepatectomy and retroperitoneal lymph node dissection at [facility] on  November 15, 2010. The pathology confirmed no evidence of malignancy in the liver or retroperitoneal lymph nodes. The precaval mass contained mature teratoma and no immature elements or malignancy were identified. Alpha-fetoprotein and beta HCG tumor markers were in normal range on May 11, 2011. Chest x-ray on May 10, 2011, was negative as well. Alpha-fetoprotein and beta hCG tumor markers were in normal range 08/08/2011 and 11/09/2011. Restaging CT scans of the chest, abdomen and pelvis 08/21/2011 showed no evidence of metastatic disease. There was stable to minimal improvement in left-sided lung nodules. There was no evidence of residual hepatic metastasis. New low-density left retroperitoneal "lesions" were favored to represent postoperative lymphoceles or seromas. Necrotic nodal metastasis was felt to be less likely. A previously noted necrotic "lesion" at the precaval space of the right retroperitoneum had resolved or been resected. AFP and beta hCG tumor markers in normal range on 01/01/2015 2. Obstruction of the proximal right ureter and moderate right hydronephrosis secondary to the large right retroperitoneal mass status post placement of double-J stent Mar 16, 2010. 3. Abdominal pain secondary to the right retroperitoneal mass, resolved. 4. History of rate related reaction to etoposide. 5. Elevated LDH, alpha-fetoprotein and beta HCG at the time of diagnosis May 2011. The tumor markers were normal on December 28, 2010, February 09, 2011, May 11, 2011, 08/08/2011, 11/09/2011, 01/09/2012, 04/09/2012, 07/02/2012, 10/07/2012,and 01/07/2014 6. History of intermittent dysuria, resolved. 7. History of "hemorrhoids." Hemorrhoids were noted on the colonoscopy 07/19/2011. 8. History of chills and sweats following bleomycin. 9. History of thrombocytopenia secondary to chemotherapy. 10. 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 11. Mild  microcytic anemia with labs confirming iron deficiency with a ferritin of 5 on 05/11/2011. Stool Hemoccult was negative x3 on 06/06/2011. Urinalysis was negative for blood on 05/23/2011. Colonoscopy by Dr. Ardis Hughs on 07/19/2011 showed findings of small external hemorrhoids with an otherwise normal examination. Stool Hemoccult negative on 08/17/2011. The hemoglobin corrected into normal range.     Disposition:  [name] remains in clinical remission from testicular cancer. We will follow-up on the AFP and beta hCG levels from today. He will return for an office and lab visit in 8 months.  He will contact us if the abdominal pain becomes more intense or consistent. I doubt the pain is related to the history of testicular cancer.  Betsy Coder, MD  06/29/2016  1:05 PM

## 2016-06-30 LAB — AFP TUMOR MARKER: AFP, Serum, Tumor Marker: 2.9 ng/mL (ref 0.0–8.3)

## 2016-06-30 LAB — BETA HCG QUANT (REF LAB)

## 2016-07-03 ENCOUNTER — Telehealth: Payer: Self-pay | Admitting: *Deleted

## 2016-07-03 NOTE — Telephone Encounter (Signed)
Per Dr. Benay Spice, pt notified that tumor markers are negative and to f/u as scheduled.  Pt has no questions or concerns at this time and is appreciative of call.

## 2016-07-03 NOTE — Telephone Encounter (Signed)
-----   Message from Ladell Pier, MD sent at 06/30/2016  4:56 PM EDT ----- Please call patient, tumor markers are negative, f/u as scheduled

## 2016-07-04 LAB — ALPHA FETO PROTEIN (PARALLEL TESTING): AFP-Tumor Marker: 2.7 ng/mL (ref <6.1)

## 2016-07-04 LAB — BETA HCG, QN (PARALLEL TESTING)

## 2016-08-30 ENCOUNTER — Encounter: Payer: Self-pay | Admitting: Family Medicine

## 2016-08-30 ENCOUNTER — Ambulatory Visit (INDEPENDENT_AMBULATORY_CARE_PROVIDER_SITE_OTHER): Payer: Self-pay | Admitting: Family Medicine

## 2016-08-30 VITALS — BP 110/57 | HR 62 | Temp 97.7°F | Resp 14 | Ht 67.0 in | Wt 213.0 lb

## 2016-08-30 DIAGNOSIS — R079 Chest pain, unspecified: Secondary | ICD-10-CM

## 2016-08-30 DIAGNOSIS — Z0000 Encounter for general adult medical examination without abnormal findings: Secondary | ICD-10-CM

## 2016-08-30 DIAGNOSIS — Z23 Encounter for immunization: Secondary | ICD-10-CM

## 2016-08-30 LAB — CBC WITH DIFFERENTIAL/PLATELET
BASOS ABS: 0 {cells}/uL (ref 0–200)
Basophils Relative: 0 %
EOS ABS: 110 {cells}/uL (ref 15–500)
Eosinophils Relative: 2 %
HCT: 41.3 % (ref 38.5–50.0)
Hemoglobin: 13 g/dL — ABNORMAL LOW (ref 13.2–17.1)
LYMPHS PCT: 32 %
Lymphs Abs: 1760 cells/uL (ref 850–3900)
MCH: 21.4 pg — AB (ref 27.0–33.0)
MCHC: 31.5 g/dL — ABNORMAL LOW (ref 32.0–36.0)
MCV: 67.9 fL — AB (ref 80.0–100.0)
MONOS PCT: 10 %
Monocytes Absolute: 550 cells/uL (ref 200–950)
NEUTROS PCT: 56 %
Neutro Abs: 3080 cells/uL (ref 1500–7800)
PLATELETS: 219 10*3/uL (ref 140–400)
RBC: 6.08 MIL/uL — ABNORMAL HIGH (ref 4.20–5.80)
RDW: 19.9 % — AB (ref 11.0–15.0)
WBC: 5.5 10*3/uL (ref 3.8–10.8)

## 2016-08-30 LAB — LIPID PANEL
CHOLESTEROL: 147 mg/dL (ref 125–200)
HDL: 38 mg/dL — ABNORMAL LOW (ref >=40)
LDL Cholesterol: 85 mg/dL (ref <130)
Total CHOL/HDL Ratio: 3.9 Ratio (ref <=5.0)
Triglycerides: 118 mg/dL (ref <150)
VLDL: 24 mg/dL (ref <30)

## 2016-08-30 LAB — COMPLETE METABOLIC PANEL WITH GFR
ALK PHOS: 92 U/L (ref 40–115)
ALT: 15 U/L (ref 9–46)
AST: 15 U/L (ref 10–40)
Albumin: 4.1 g/dL (ref 3.6–5.1)
BUN: 14 mg/dL (ref 7–25)
CALCIUM: 9 mg/dL (ref 8.6–10.3)
CHLORIDE: 105 mmol/L (ref 98–110)
CO2: 25 mmol/L (ref 20–31)
CREATININE: 0.87 mg/dL (ref 0.60–1.35)
GFR, Est African American: >89 mL/min (ref >=60)
GFR, Est Non African American: >89 mL/min (ref >=60)
GLUCOSE: 82 mg/dL (ref 65–99)
Potassium: 4.4 mmol/L (ref 3.5–5.3)
SODIUM: 139 mmol/L (ref 135–146)
Total Bilirubin: 0.5 mg/dL (ref 0.2–1.2)
Total Protein: 7.1 g/dL (ref 6.1–8.1)

## 2016-08-30 LAB — HIV ANTIBODY (ROUTINE TESTING W REFLEX): HIV: NONREACTIVE

## 2016-08-30 NOTE — Patient Instructions (Signed)
Follow-up in ED for persistent chest pain .

## 2016-08-30 NOTE — Progress Notes (Signed)
Keith Price, is a 35 y.o. male  [id number]  [id number]  DOB - 03/23/1981  CC:  Chief Complaint  Patient presents with  . Establish Care       HPI: Keith Price is a 35 y.o. male here to establish care. He has a history of testicular cancer which is in remission. He presents here as he does not have a PCP and was advised to establish. He is on no current medications. He does not spontaneously offer any complaints.  When asked, he admits to some chest pain and palpitations. He describes as a pressure in the center of his chest. His may happen at rest or sometimes when he is lifting something heavy at work. He also admits to mild shortness of breath during this time. This may happen 1-3 times a day and last for 20-30 minutes. He denies any associated symptoms suggestive of a cardiac origin. He admits to insomnia 2-3 times a week and often this is when the chest discomfort occurs. An EKG today shows no changes suggestive of ischemia or previous MI. There is a bradycardia of 55 which is normal for him.  No Known Allergies Past Medical History:  Diagnosis Date  . Anemia   . Testicular cancer dx'd 03/16/10   stage IV  . Testicular cancer 04/05/2010   Current Outpatient Prescriptions on File Prior to Visit  Medication Sig Dispense Refill  . Cyanocobalamin (VITAMIN B 12 PO) Take 1 tablet by mouth daily.    . Glucosamine-Chondroitin-MSM (CONDROLITE PO) Take 1 tablet by mouth daily.     No current facility-administered medications on file prior to visit.    Family History  Problem Relation Age of Onset  . Diabetes Father   . Diabetes Sister   . Diabetes Brother    Social History   Social History  . Marital status: Married    Spouse name: N/A  . Number of children: 3  . Years of education: N/A   Occupational History  . Not on file.   Social History Main Topics  . Smoking status: Never Smoker  . Smokeless tobacco: Never Used  . Alcohol use No  . Drug use:  No  . Sexual activity: Not on file   Other Topics Concern  . Not on file   Social History Narrative  . No narrative on file    Review of Systems: Constitutional: + heat intolerance Skin: Negative HENT: Negative  Eyes: Negative  Neck: Negative Respiratory: Negative, except for SOB as described in HPI Cardiovascular: Negative except for chest pain as described in HPI.  Gastrointestinal: Negative Genitourinary: Negative  Musculoskeletal: Negative   Neurological: Negative except for some superficial numbness in his left anteriolateral thigh. Hematological: Negative  Psychiatric/Behavioral: Negative    Objective:   Vitals:   08/30/16 1057  BP: (!) 110/57  Pulse: 62  Resp: 14  Temp: 97.7 F (36.5 C)    Physical Exam: Constitutional: Patient appears well-developed and well-nourished. No distress. HENT: Normocephalic, atraumatic, External right and left ear normal. Oropharynx is clear and moist.  Eyes: Conjunctivae and EOM are normal. PERRLA, no scleral icterus. Neck: Normal ROM. Neck supple. No lymphadenopathy, No thyromegaly. CVS: RRR, S1/S2 +, no murmurs, no gallops, no rubs Pulmonary: Effort and breath sounds normal, no stridor, rhonchi, wheezes, rales.  Abdominal: Soft. Normoactive BS,, no distension, tenderness, rebound or guarding.  Musculoskeletal: Normal range of motion. No edema and no tenderness.  Neuro: Alert.Normal muscle tone coordination. Non-focal Skin: Skin is warm and dry. No rash  noted. Not diaphoretic. No erythema. No pallor. Psychiatric: Normal mood and affect. Behavior, judgment, thought content normal.  Lab Results  Component Value Date   WBC 6.9 07/07/2013   HGB 16.3 07/07/2013   HCT 48.4 07/07/2013   MCV 87.2 07/07/2013   PLT 159 07/07/2013   Lab Results  Component Value Date   CREATININE 0.96 08/08/2011   BUN 20 08/08/2011   NA 139 08/08/2011   K 3.9 08/08/2011   CL 107 08/08/2011   CO2 28 08/08/2011    No results found for:  HGBA1C Lipid Panel  No results found for: CHOL, TRIG, HDL, CHOLHDL, VLDL, LDLCALC     Assessment and plan:   1. Healthcare maintenance  - CBC with Differential - COMPLETE METABOLIC PANEL WITH GFR - Lipid panel - Hemoglobin A1c - HIV antibody (with reflex) - Flu Vaccine QUAD 36+ mos PF IM (Fluarix & Fluzone Quad PF) - Tdap vaccine greater than or equal to 7yo IM  2. Chest pain, unspecified type  - EKG 12-Lead   No Follow-up on file.  The patient was given clear instructions to go to ER or return to medical center if symptoms don't improve, worsen or new problems develop. The patient verbalized understanding.    Micheline Chapman FNP  08/30/2016, 1:20 PM

## 2016-08-31 LAB — HEMOGLOBIN A1C
HEMOGLOBIN A1C: 5.4 % (ref <5.7)
MEAN PLASMA GLUCOSE: 108 mg/dL

## 2016-12-04 ENCOUNTER — Ambulatory Visit: Payer: Self-pay | Admitting: Family Medicine

## 2016-12-04 ENCOUNTER — Ambulatory Visit (HOSPITAL_COMMUNITY)
Admission: RE | Admit: 2016-12-04 | Discharge: 2016-12-04 | Disposition: A | Discharge status: Home / Self Care | Payer: Self-pay | Source: Ambulatory Visit | Attending: Family Medicine | Admitting: Family Medicine

## 2016-12-04 ENCOUNTER — Encounter: Payer: Self-pay | Admitting: Family Medicine

## 2016-12-04 VITALS — BP 117/57 | HR 72 | Temp 97.8°F | Resp 12 | Ht 67.0 in | Wt 211.0 lb

## 2016-12-04 DIAGNOSIS — M542 Cervicalgia: Secondary | ICD-10-CM

## 2016-12-04 NOTE — Patient Instructions (Signed)
Will call with x-ray results Recommend naprosyn, over the counter for pain.

## 2016-12-06 ENCOUNTER — Telehealth: Payer: Self-pay

## 2016-12-06 NOTE — Telephone Encounter (Signed)
Called using [employer]. No answer and no way to leave a message. Will try later. Thanks!

## 2016-12-06 NOTE — Telephone Encounter (Signed)
-----   Message from Micheline Chapman, NP sent at 12/06/2016 10:03 AM EST ----- Let him know x ray normal.

## 2016-12-07 NOTE — Telephone Encounter (Signed)
Tried to call patient using interpreter ID # [phone] ( using [employer]) no answer, when straight to voicemail and no voicemail had been set up yet. I was unable to leave a message.

## 2017-03-01 ENCOUNTER — Other Ambulatory Visit: Payer: Self-pay

## 2017-03-01 ENCOUNTER — Ambulatory Visit: Payer: Self-pay | Admitting: Nurse Practitioner

## 2017-03-01 ENCOUNTER — Telehealth: Payer: Self-pay | Admitting: Nurse Practitioner

## 2017-03-01 NOTE — Telephone Encounter (Signed)
Patient missed his appointment and needs to reschedule.  I spoke with Clarise Cruz and she said bring him in at 3:30 on Monday with a 3:15 lab please.  Patient has been notified

## 2017-03-02 ENCOUNTER — Telehealth: Payer: Self-pay | Admitting: Oncology

## 2017-03-02 NOTE — Telephone Encounter (Signed)
Appointments scheduled for 03/05/17 with Lattie Haw @ 3:30 pm, with labs @ 3 pm, per Ashley/Sara/patient call msg. Patient is aware per Caryl Pina.

## 2017-03-05 ENCOUNTER — Other Ambulatory Visit (HOSPITAL_BASED_OUTPATIENT_CLINIC_OR_DEPARTMENT_OTHER): Payer: Self-pay

## 2017-03-05 ENCOUNTER — Ambulatory Visit (HOSPITAL_BASED_OUTPATIENT_CLINIC_OR_DEPARTMENT_OTHER): Payer: Self-pay | Admitting: Nurse Practitioner

## 2017-03-05 ENCOUNTER — Telehealth: Payer: Self-pay | Admitting: Oncology

## 2017-03-05 VITALS — BP 113/67 | HR 65 | Temp 98.0°F | Resp 16 | Wt 212.5 lb

## 2017-03-05 DIAGNOSIS — Z8547 Personal history of malignant neoplasm of testis: Secondary | ICD-10-CM

## 2017-03-05 DIAGNOSIS — C6211 Malignant neoplasm of descended right testis: Secondary | ICD-10-CM

## 2017-03-05 DIAGNOSIS — D509 Iron deficiency anemia, unspecified: Secondary | ICD-10-CM

## 2017-03-05 DIAGNOSIS — C6291 Malignant neoplasm of right testis, unspecified whether descended or undescended: Secondary | ICD-10-CM

## 2017-03-05 NOTE — Progress Notes (Addendum)
[facility] OFFICE PROGRESS NOTE   Diagnosis:  Testicular cancer  INTERVAL HISTORY:   Keith Price returns as scheduled. He feels well. He has a good appetite. Weight is stable. He denies shortness of breath. No cough. No nausea or vomiting. Bowels moving regularly. After lifting heavy boxes at work he notes pain at the low abdomen.  Objective:  Vital signs in last 24 hours:  Blood pressure 113/67, pulse 65, temperature 98 F (36.7 C), temperature source Oral, resp. rate 16, weight 212 lb 8 oz (96.4 kg), SpO2 99 %.    HEENT: Neck without mass. Lymphatics: No palpable cervical, supraclavicular, axillary or inguinal lymph nodes. Resp: Lungs clear bilaterally. Cardio: Regular rate and rhythm. GI: Abdomen soft and nontender. No organomegaly. No mass. Vascular: No leg edema. GU: Left testis without mass.  Lab Results:  Lab Results  Component Value Date   WBC 5.5 08/30/2016   HGB 13.0 (L) 08/30/2016   HCT 41.3 08/30/2016   MCV 67.9 (L) 08/30/2016   PLT 219 08/30/2016   NEUTROABS 3,080 08/30/2016    Imaging:  No results found.  Medications: I have reviewed the patient's current medications.  Assessment/Plan: 1. 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 Staging CT scan showed a large right retroperitoneal mass, scattered tiny bilateral pulmonary parenchymal nodules and a 1-cm subtle lesion in the posterior right liver. Brain CT was negative for evidence of metastatic disease. He completed 4 cycles of BEP chemotherapy with the last cycle given June 14, 2010. The alpha-fetoprotein and beta HCG tumor markers were normal on July 10, 2010. Restaging CTs of the chest, abdomen and pelvis on September 09, 2010, revealed resolution of nodular densities in the right lung, a decrease in the size of liver lesions  and a further decrease in the size of the right retroperitoneal mass. New inflammatory changes were noted in the right lung. He underwent a right hepatectomy and retroperitoneal lymph node dissection at [facility] on November 15, 2010. The pathology confirmed no evidence of malignancy in the liver or retroperitoneal lymph nodes. The precaval mass contained mature teratoma and no immature elements or malignancy were identified. Alpha-fetoprotein and beta HCG tumor markers were in normal range on May 11, 2011. Chest x-ray on May 10, 2011, was negative as well. Alpha-fetoprotein and beta hCG tumor markers were in normal range 08/08/2011 and 11/09/2011. Restaging CT scans of the chest, abdomen and pelvis 08/21/2011 showed no evidence of metastatic disease. There was stable to minimal improvement in left-sided lung nodules. There was no evidence of residual hepatic metastasis. New low-density left retroperitoneal "lesions" were favored to represent postoperative lymphoceles or seromas. Necrotic nodal metastasis was felt to be less likely. A previously noted necrotic "lesion" at the precaval space of the right retroperitoneum had resolved or been resected. AFP and beta hCG tumor markers in normal range on 01/01/2015 2. Obstruction of the proximal right ureter and moderate right hydronephrosis secondary to the large right retroperitoneal mass status post placement of double-J stent Mar 16, 2010. 3. Abdominal pain secondary to the right retroperitoneal mass, resolved. 4. History of rate related reaction to etoposide. 5. Elevated LDH, alpha-fetoprotein and beta HCG at the time of diagnosis May 2011. The tumor markers were normal on December 28, 2010, February 09, 2011, May 11, 2011, 08/08/2011, 11/09/2011, 01/09/2012, 04/09/2012, 07/02/2012, 10/07/2012,and 01/07/2014 6. History of intermittent dysuria, resolved. 7. History of "  hemorrhoids." Hemorrhoids were noted on the colonoscopy 07/19/2011. 8. History of chills and sweats  following bleomycin. 9. History of thrombocytopenia secondary to chemotherapy. 10. 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 11. Mild microcytic anemia with labs confirming iron deficiency with a ferritin of 5 on 05/11/2011. Stool Hemoccult was negative x3 on 06/06/2011. Urinalysis was negative for blood on 05/23/2011. Colonoscopy by Dr. Ardis Hughs on 07/19/2011 showed findings of small external hemorrhoids with an otherwise normal examination. Stool Hemoccult negative on 08/17/2011. The hemoglobin corrected into normal range.   Disposition: Keith Price remains in clinical remission from testicular cancer. We will follow-up on the AFP and beta hCG levels from today. He will return for a follow-up visit in one year.  Patient seen with Dr. Benay Spice.    Ned Card ANP/GNP-BC   03/05/2017  4:13 PM  This was a shared visit with Ned Card. He remains in clinical remission from testicular cancer. We will follow-up on the tumor markers from today. A CBC in October 2017 fountain to have mild microcytic anemia. We will ask him to return for a CBC and ferritin level.   Julieanne Manson, M.D.

## 2017-03-05 NOTE — Telephone Encounter (Signed)
Gave patient AVS and calender per 4/23 los.  

## 2017-03-06 LAB — AFP TUMOR MARKER: AFP, Serum, Tumor Marker: 3 ng/mL (ref 0.0–8.3)

## 2017-03-06 LAB — BETA HCG QUANT (REF LAB): hCG Quant: <1 m[IU]/mL (ref 0–3)

## 2017-03-08 ENCOUNTER — Telehealth: Payer: Self-pay

## 2017-03-08 DIAGNOSIS — C6211 Malignant neoplasm of descended right testis: Secondary | ICD-10-CM

## 2017-03-08 NOTE — Telephone Encounter (Signed)
Attempted to call patient on multiple attempts as well as emergency contacts. Need to schedule follow up lab appt with patient

## 2017-03-09 NOTE — Telephone Encounter (Signed)
Called and spoke with patient he can come in Monday at 330pm for labs. POF sent, orders placed.

## 2017-03-09 NOTE — Telephone Encounter (Signed)
-----   Message from Shari Heritage, LPN sent at [birth year]76[birth year]1950  4:45 PM EDT -----   ----- Message ----- From: Owens Shark, NP Sent: 03/07/2017   9:01 AM To: Shari Heritage, LPN  Please call him--Dr Benay Spice would like him to return for additional labs this week or next to follow-up mild anemia. Will need CBC and ferritin. Let me know what day and I will enter orders. Thanks

## 2017-03-12 ENCOUNTER — Other Ambulatory Visit (HOSPITAL_BASED_OUTPATIENT_CLINIC_OR_DEPARTMENT_OTHER): Payer: Self-pay

## 2017-03-12 DIAGNOSIS — D509 Iron deficiency anemia, unspecified: Secondary | ICD-10-CM

## 2017-03-12 DIAGNOSIS — C6211 Malignant neoplasm of descended right testis: Secondary | ICD-10-CM

## 2017-03-12 LAB — CBC WITH DIFFERENTIAL/PLATELET
BASO%: 0.9 % (ref 0.0–2.0)
Basophils Absolute: 0.1 10*3/uL (ref 0.0–0.1)
EOS ABS: 0.1 10*3/uL (ref 0.0–0.5)
EOS%: 0.9 % (ref 0.0–7.0)
HCT: 40.3 % (ref 38.4–49.9)
HEMOGLOBIN: 13.1 g/dL (ref 13.0–17.1)
LYMPH%: 30.2 % (ref 14.0–49.0)
MCH: 23.7 pg — ABNORMAL LOW (ref 27.2–33.4)
MCHC: 32.5 g/dL (ref 32.0–36.0)
MCV: 73 fL — AB (ref 79.3–98.0)
MONO#: 0.5 10*3/uL (ref 0.1–0.9)
MONO%: 9.5 % (ref 0.0–14.0)
NEUT%: 58.5 % (ref 39.0–75.0)
NEUTROS ABS: 3.3 10*3/uL (ref 1.5–6.5)
PLATELETS: 198 10*3/uL (ref 140–400)
RBC: 5.52 10*6/uL (ref 4.20–5.82)
RDW: 17.9 % — ABNORMAL HIGH (ref 11.0–14.6)
WBC: 5.6 10*3/uL (ref 4.0–10.3)
lymph#: 1.7 10*3/uL (ref 0.9–3.3)

## 2017-03-13 LAB — FERRITIN: Ferritin: 4 ng/ml — ABNORMAL LOW (ref 22–316)

## 2017-03-15 ENCOUNTER — Telehealth: Payer: Self-pay | Admitting: *Deleted

## 2017-03-15 DIAGNOSIS — D509 Iron deficiency anemia, unspecified: Secondary | ICD-10-CM

## 2017-03-15 NOTE — Telephone Encounter (Signed)
-----   Message from Owens Shark, NP sent at 03/14/2017  3:30 PM EDT ----- Please let him know he has iron deficiency.  He needs to come in for a urinalysis and repeat stool cards.  Is he donating blood?

## 2017-03-15 NOTE — Telephone Encounter (Signed)
Telephone call to patient and emergency contacts- no answer. Unable to relay lab results. Message sent to scheduling for lab appt- orders entered per Ned Card, NP. Will attempt again to reach patient about lab results.

## 2017-03-19 ENCOUNTER — Telehealth: Payer: Self-pay | Admitting: Oncology

## 2017-03-19 NOTE — Telephone Encounter (Signed)
sw pt to confirm 5/14 lab appt at 330 per LOS

## 2017-03-26 ENCOUNTER — Other Ambulatory Visit (HOSPITAL_BASED_OUTPATIENT_CLINIC_OR_DEPARTMENT_OTHER): Payer: Self-pay

## 2017-03-26 DIAGNOSIS — C6211 Malignant neoplasm of descended right testis: Secondary | ICD-10-CM

## 2017-03-26 DIAGNOSIS — D509 Iron deficiency anemia, unspecified: Secondary | ICD-10-CM

## 2017-03-26 LAB — URINALYSIS, MICROSCOPIC - CHCC
BILIRUBIN (URINE): NEGATIVE
Blood: NEGATIVE
GLUCOSE UR CHCC: NEGATIVE mg/dL
Ketones: NEGATIVE mg/dL
LEUKOCYTE ESTERASE: NEGATIVE
NITRITE: NEGATIVE
Protein: NEGATIVE mg/dL
RBC / HPF: NEGATIVE (ref 0–2)
Specific Gravity, Urine: 1.03 (ref 1.003–1.035)
UROBILINOGEN UR: 0.2 mg/dL (ref 0.2–1)
WBC, UA: NEGATIVE (ref 0–?)
pH: 6 (ref 4.6–8.0)

## 2017-04-03 ENCOUNTER — Telehealth: Payer: Self-pay | Admitting: *Deleted

## 2017-04-03 NOTE — Telephone Encounter (Signed)
Unable to lm for patient- VM not set up. Patient needs to return to [facility] and pick up stool cards. Lab reported stool cards patient dropped off are not completed properly. Patient needs to re collect. Will try to contact patient again tomorrow.

## 2017-08-13 ENCOUNTER — Encounter: Payer: Self-pay | Admitting: Family Medicine

## 2017-08-13 ENCOUNTER — Ambulatory Visit (INDEPENDENT_AMBULATORY_CARE_PROVIDER_SITE_OTHER): Payer: Self-pay | Admitting: Family Medicine

## 2017-08-13 VITALS — BP 117/66 | HR 72 | Temp 98.7°F | Resp 14 | Ht 67.0 in | Wt 205.0 lb

## 2017-08-13 DIAGNOSIS — H9201 Otalgia, right ear: Secondary | ICD-10-CM

## 2017-08-13 DIAGNOSIS — H6123 Impacted cerumen, bilateral: Secondary | ICD-10-CM

## 2017-08-13 DIAGNOSIS — Z23 Encounter for immunization: Secondary | ICD-10-CM

## 2017-08-13 MED ORDER — OFLOXACIN 0.3 % OT SOLN: 5.0000 [drp] | Freq: Every day | OTIC | 0 refills | Status: DC

## 2017-08-13 MED FILL — OFLOXACIN 0.3% EAR DROPS: 0.3 | 15 days supply | Qty: 5 | Fill #0

## 2017-08-13 NOTE — Progress Notes (Signed)
   Patient ID: Keith Price, male    DOB: 06-06-1981, 36 y.o.   MRN: [id number]  PCP: Scot Jun, FNP  Chief Complaint  Patient presents with  . Ear Pain    BOTH    Subjective:  HPI Keith Price is a 36 y.o. male presents for evaluation of bilateral ear pain. [name] reports that he went swimming a few weeks prior and developed the sensation of fluid in both ears with diminished hearing. Initially, he experienced no pain, but today complains of right ear pain and continues to experience ear. He denies associated ear drainage, nasal drainage, sore throat, or headache.  Characterizes right ear pain as aching and pain is localized to inner canal of right ear. Social History   Social History  . Marital status: Married    Spouse name: N/A  . Number of children: 3  . Years of education: N/A   Occupational History  . Not on file.   Social History Main Topics  . Smoking status: Never Smoker  . Smokeless tobacco: Never Used  . Alcohol use No  . Drug use: No  . Sexual activity: Not on file   Other Topics Concern  . Not on file   Social History Narrative  . No narrative on file    Family History  Problem Relation Age of Onset  . Diabetes Father   . Diabetes Sister   . Diabetes Brother    Review of Systems SEE HPI Patient Active Problem List   Diagnosis Date Noted  . Testicular cancer ([place]) 04/05/2010  . 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 ABDOMINAL MASS 03/16/2010    No Known Allergies  Prior to Admission medications   Medication Sig Start Date End Date Taking? Authorizing Provider  Cyanocobalamin (VITAMIN B 12 PO) Take 1 tablet by mouth daily.    [provider]  Glucosamine-Chondroitin-MSM (CONDROLITE PO) Take 1 tablet by mouth daily.    [provider]   Past Medical, Surgical Family and Social History reviewed and updated.   Objective:   Today's Vitals   08/13/17 1509  BP: 117/66  Pulse: 72  Resp: 14  Temp: 98.7 F  (37.1 C)  TempSrc: Oral  SpO2: 99%  Weight: 205 lb (93 kg)  Height: 5\' 7"  (1.702 m)    Wt Readings from Last 3 Encounters:  08/13/17 205 lb (93 kg)  03/05/17 212 lb 8 oz (96.4 kg)  12/04/16 211 lb (95.7 kg)   Physical Exam  Constitutional: He is oriented to person, place, and time. He appears well-developed and well-nourished.  HENT:  Head: Normocephalic and atraumatic.  Eyes: Pupils are equal, round, and reactive to light. Conjunctivae and EOM are normal.  Neck: Normal range of motion. Neck supple.  Cardiovascular: Normal rate, regular rhythm, normal heart sounds and intact distal pulses.   Pulmonary/Chest: Effort normal and breath sounds normal.  Neurological: He is alert and oriented to person, place, and time.  Skin: Skin is warm and dry.  Psychiatric: He has a normal mood and affect. His behavior is normal. Judgment and thought content normal.   Assessment & Plan:  1. Bilateral impacted cerumen- defer irrigation  2. Right ear pain-start ofloxacin 0.3%. 5 drops daily, right ear daily  3. Need for influenza vaccination- Flu Vaccine QUAD 36+ mos IM  RTC: 1 week for right ear recheck.   Carroll Sage. Kenton Kingfisher, MSN, FNP-C [facility]  [address] [place], [place] [address] [phone]

## 2017-08-14 DIAGNOSIS — Z23 Encounter for immunization: Secondary | ICD-10-CM

## 2017-08-20 ENCOUNTER — Ambulatory Visit: Payer: Self-pay | Admitting: Family Medicine

## 2017-08-22 ENCOUNTER — Ambulatory Visit (INDEPENDENT_AMBULATORY_CARE_PROVIDER_SITE_OTHER): Payer: Self-pay | Admitting: Family Medicine

## 2017-08-22 DIAGNOSIS — H6123 Impacted cerumen, bilateral: Secondary | ICD-10-CM

## 2017-08-26 NOTE — Progress Notes (Signed)
Ear irrigation visit only.

## 2018-03-01 ENCOUNTER — Telehealth: Payer: Self-pay | Admitting: Oncology

## 2018-03-01 NOTE — Telephone Encounter (Signed)
Patient called to verify appointment due to phone he recieved

## 2018-03-05 ENCOUNTER — Telehealth: Payer: Self-pay | Admitting: Oncology

## 2018-03-05 ENCOUNTER — Inpatient Hospital Stay (HOSPITAL_BASED_OUTPATIENT_CLINIC_OR_DEPARTMENT_OTHER): Payer: Self-pay | Admitting: Oncology

## 2018-03-05 ENCOUNTER — Other Ambulatory Visit: Payer: Self-pay

## 2018-03-05 ENCOUNTER — Encounter: Payer: Self-pay | Admitting: Oncology

## 2018-03-05 ENCOUNTER — Inpatient Hospital Stay: Payer: Self-pay | Attending: Oncology

## 2018-03-05 VITALS — BP 116/65 | HR 67 | Temp 98.2°F | Resp 17 | Ht 67.0 in | Wt 203.0 lb

## 2018-03-05 DIAGNOSIS — C6211 Malignant neoplasm of descended right testis: Secondary | ICD-10-CM

## 2018-03-05 DIAGNOSIS — D509 Iron deficiency anemia, unspecified: Secondary | ICD-10-CM

## 2018-03-05 NOTE — Progress Notes (Signed)
[facility] OFFICE PROGRESS NOTE   Diagnosis: Testicular cancer  INTERVAL HISTORY:   [name] returns as scheduled.  He feels well.  He is working.  Good appetite.  He reports mild intermittent discomfort in the left lower abdomen/pelvic area.  No other complaint.  Objective:  Vital signs in last 24 hours:  Blood pressure 116/65, pulse 67, temperature 98.2 F (36.8 C), temperature source Oral, resp. rate 17, height 5\' 7"  (1.702 m), weight 203 lb (92.1 kg), SpO2 99 %.    HEENT: Neck without mass Lymphatics: No cervical, supraclavicular, axillary, or inguinal nodes Resp: Lungs clear bilaterally Cardio: Regular rate and rhythm GI: No hepatosplenomegaly, no mass, nontender, no apparent hernia Vascular: No leg edema GU: Uncircumcised male, status post right orchiectomy the left testicle without mass    Lab Results:  Beta hCG and AFP pending Medications: I have reviewed the patient's current medications.   Assessment/Plan: 1. 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 Staging CT scan showed a large right retroperitoneal mass, scattered tiny bilateral pulmonary parenchymal nodules and a 1-cm subtle lesion in the posterior right liver. Brain CT was negative for evidence of metastatic disease. He completed 4 cycles of BEP chemotherapy with the last cycle given June 14, 2010. The alpha-fetoprotein and beta HCG tumor markers were normal on July 10, 2010. Restaging CTs of the chest, abdomen and pelvis on September 09, 2010, revealed resolution of nodular densities in the right lung, a decrease in the size of liver lesions and a further decrease in the size of the right retroperitoneal mass. New inflammatory changes were noted in the right lung. He underwent a right hepatectomy and retroperitoneal lymph node  dissection at [facility] on November 15, 2010. The pathology confirmed no evidence of malignancy in the liver or retroperitoneal lymph nodes. The precaval mass contained mature teratoma and no immature elements or malignancy were identified. Alpha-fetoprotein and beta HCG tumor markers were in normal range on May 11, 2011. Chest x-ray on May 10, 2011, was negative as well. Alpha-fetoprotein and beta hCG tumor markers were in normal range 08/08/2011 and 11/09/2011. Restaging CT scans of the chest, abdomen and pelvis 08/21/2011 showed no evidence of metastatic disease. There was stable to minimal improvement in left-sided lung nodules. There was no evidence of residual hepatic metastasis. New low-density left retroperitoneal "lesions" were favored to represent postoperative lymphoceles or seromas. Necrotic nodal metastasis was felt to be less likely. A previously noted necrotic "lesion" at the precaval space of the right retroperitoneum had resolved or been resected. AFP and beta hCG tumor markers in normal range on 01/01/2015 2. Obstruction of the proximal right ureter and moderate right hydronephrosis secondary to the large right retroperitoneal mass status post placement of double-J stent Mar 16, 2010. 3. Abdominal pain secondary to the right retroperitoneal mass, resolved. 4. History of rate related reaction to etoposide. 5. Elevated LDH, alpha-fetoprotein and beta HCG at the time of diagnosis May 2011.  6. History of intermittent dysuria, resolved. 7. History of "hemorrhoids." Hemorrhoids were noted on the colonoscopy 07/19/2011. 8. History of chills and sweats following bleomycin. 9. History of thrombocytopenia secondary to chemotherapy. 10. 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 11. Mild microcytic anemia with labs confirming iron deficiency with a ferritin of 5 on 05/11/2011. Stool Hemoccult was negative x3 on 06/06/2011.  Urinalysis  was negative for blood on 05/23/2011. Colonoscopy by Dr. Ardis Hughs on 07/19/2011 showed findings of small external hemorrhoids with an otherwise normal examination. Stool Hemoccult negative on 08/17/2011. The hemoglobin corrected into normal range.  Red cell microcytosis with iron deficiency 03/12/2017    Disposition: He remains in clinical remission from testicular cancer.  We will follow-up on the serum tumor markers from today. He had persistent iron deficiency when he was here last year.  He did not return stool Hemoccults.  We will ask him to return for a CBC, ferritin, and stool Hemoccults when we contact him with the tumor marker results.  [name] will return for an office visit in 1 year.  15 minutes were spent with the patient today.  The majority of the time was used for counseling and coordination of care.  Betsy Coder, MD  03/05/2018  3:51 PM

## 2018-03-05 NOTE — Telephone Encounter (Signed)
Scheduled appt per 4/23 los - Gave patient AVS and calender per los.

## 2018-03-06 LAB — BETA HCG QUANT (REF LAB): hCG Quant: <1 m[IU]/mL (ref 0–3)

## 2018-03-06 LAB — AFP TUMOR MARKER: AFP, SERUM, TUMOR MARKER: 2.9 ng/mL (ref 0.0–8.3)

## 2018-03-07 ENCOUNTER — Telehealth: Payer: Self-pay | Admitting: Oncology

## 2018-03-07 ENCOUNTER — Telehealth: Payer: Self-pay

## 2018-03-07 NOTE — Telephone Encounter (Signed)
Called pt to inquire about availability for lab appt. No answer, unable to LVM. Schedule message sent.

## 2018-03-07 NOTE — Telephone Encounter (Signed)
Unable to reach patient or leave vmail.  . Scheduled appt per 4/25 sch message - sent reminder letter in the mail.

## 2018-03-15 ENCOUNTER — Telehealth: Payer: Self-pay | Admitting: *Deleted

## 2018-03-15 NOTE — Telephone Encounter (Signed)
Spoke with Almyra Free, [place] interpreter. Requested she call pt to inform him of lab appt 03/21/18. Instructed her to have him complete stool hemoccult cards. He can pick them up on 03/21/18.

## 2018-03-21 ENCOUNTER — Inpatient Hospital Stay: Payer: Self-pay | Attending: Oncology

## 2018-03-21 DIAGNOSIS — D509 Iron deficiency anemia, unspecified: Secondary | ICD-10-CM | POA: Insufficient documentation

## 2018-03-21 LAB — CBC WITH DIFFERENTIAL (CANCER CENTER ONLY)
BASOS PCT: 1 %
Basophils Absolute: 0 10*3/uL (ref 0.0–0.1)
EOS ABS: 0 10*3/uL (ref 0.0–0.5)
Eosinophils Relative: 0 %
HCT: 46.5 % (ref 38.4–49.9)
HEMOGLOBIN: 15.5 g/dL (ref 13.0–17.1)
Lymphocytes Relative: 18 %
Lymphs Abs: 1.1 10*3/uL (ref 0.9–3.3)
MCH: 29 pg (ref 27.2–33.4)
MCHC: 33.4 g/dL (ref 32.0–36.0)
MCV: 86.9 fL (ref 79.3–98.0)
Monocytes Absolute: 0.5 10*3/uL (ref 0.1–0.9)
Monocytes Relative: 7 %
NEUTROS PCT: 74 %
Neutro Abs: 4.6 10*3/uL (ref 1.5–6.5)
Platelet Count: 171 10*3/uL (ref 140–400)
RBC: 5.36 MIL/uL (ref 4.20–5.82)
RDW: 14.2 % (ref 11.0–14.6)
WBC: 6.3 10*3/uL (ref 4.0–10.3)

## 2018-03-21 LAB — FERRITIN: FERRITIN: 23 ng/mL (ref 22–316)

## 2018-03-22 ENCOUNTER — Telehealth: Payer: Self-pay

## 2018-03-22 DIAGNOSIS — C6211 Malignant neoplasm of descended right testis: Secondary | ICD-10-CM

## 2018-03-22 NOTE — Telephone Encounter (Addendum)
Pt voiced understanding of messgae below  ----- Message from Ladell Pier, MD sent at 03/21/2018  6:42 PM EDT ----- Please call patient, hemoglobin is normal, iron stores in the low end of the normal range, be sure he has a ferritin level and CBC with the next office visit, and schedule a CBC/ferritin for 6 months

## 2018-03-25 ENCOUNTER — Telehealth: Payer: Self-pay | Admitting: Oncology

## 2018-03-25 NOTE — Telephone Encounter (Signed)
Scheduled appt per 5/10 sch message - sent reminder letter in the mail with appt date and time.

## 2018-05-28 ENCOUNTER — Encounter: Payer: Self-pay | Admitting: Family Medicine

## 2018-05-28 ENCOUNTER — Ambulatory Visit (INDEPENDENT_AMBULATORY_CARE_PROVIDER_SITE_OTHER): Payer: Self-pay | Admitting: Family Medicine

## 2018-05-28 VITALS — BP 118/60 | HR 80 | Temp 98.2°F | Ht 67.0 in | Wt 199.0 lb

## 2018-05-28 DIAGNOSIS — B001 Herpesviral vesicular dermatitis: Secondary | ICD-10-CM

## 2018-05-28 DIAGNOSIS — Z131 Encounter for screening for diabetes mellitus: Secondary | ICD-10-CM

## 2018-05-28 DIAGNOSIS — Z09 Encounter for follow-up examination after completed treatment for conditions other than malignant neoplasm: Secondary | ICD-10-CM

## 2018-05-28 DIAGNOSIS — G4700 Insomnia, unspecified: Secondary | ICD-10-CM

## 2018-05-28 LAB — POCT URINALYSIS DIP (MANUAL ENTRY)
Bilirubin, UA: NEGATIVE
Blood, UA: NEGATIVE
Glucose, UA: NEGATIVE mg/dL
Leukocytes, UA: NEGATIVE
Nitrite, UA: NEGATIVE
Spec Grav, UA: >=1.03 — AB (ref 1.010–1.025)
Urobilinogen, UA: 1 E.U./dL
pH, UA: 5.5 (ref 5.0–8.0)

## 2018-05-28 LAB — POCT GLYCOSYLATED HEMOGLOBIN (HGB A1C): Hemoglobin A1C: 5.1 % (ref 4.0–5.6)

## 2018-05-28 MED ORDER — TRAZODONE HCL 50 MG PO TABS: 25.0000 mg | ORAL_TABLET | Freq: Every evening | ORAL | 2 refills | Status: AC | PRN

## 2018-05-28 MED ORDER — VALACYCLOVIR HCL 500 MG PO TABS: 500.0000 mg | ORAL_TABLET | Freq: Every day | ORAL | 2 refills | Status: DC

## 2018-05-28 NOTE — Progress Notes (Signed)
Subjective:    Patient ID: Keith Price, male    DOB: [birth year], 37 y.o.   MRN: [id number]  PCP: Kathe Becton, NP  Chief Complaint  Patient presents with  . Insomnia  . Fatigue   HPI  Keith Price has a past medical history of Testicular Cancer and Anemia. He is here today for follow up.   Current Status: Since his last office visit, he is doing well.   He reports mild fatigue lately. He denies fevers, chills, recent infections, weight loss, and night sweats.   He has occasional headaches. He has mouth sores that occasionally flare up on lips. States that sores at not painful/tender at this time. He has not had any visual changes, dizziness, and falls.   No chest pain, heart palpitations, cough and shortness of breath reported. No reports of GI problems such as nausea, vomiting, diarrhea, and constipation. He has no reports of dysuria and hematuria. He states that he has occasional blood in stools, which is r/t his history of hemorrhoids.   No depression or anxiety.   He denies pain today.   Past Medical History:  Diagnosis Date  . Anemia   . Testicular cancer ([place]) dx'd 03/16/10   stage IV  . Testicular cancer ([place]) 04/05/2010    Family History  Problem Relation Age of Onset  . Diabetes Father   . Diabetes Sister   . Diabetes Brother     Social History   Socioeconomic History  . Marital status: Married    Spouse name: Not on file  . Number of children: 3  . Years of education: Not on file  . Highest education level: Not on file  Occupational History  . Not on file  Social Needs  . Financial resource strain: Not on file  . Food insecurity:    Worry: Not on file    Inability: Not on file  . Transportation needs:    Medical: Not on file    Non-medical: Not on file  Tobacco Use  . Smoking status: Never Smoker  . Smokeless tobacco: Never Used  Substance and Sexual Activity  . Alcohol use: No  . Drug use: No  . Sexual activity: Not on file   Lifestyle  . Physical activity:    Days per week: Not on file    Minutes per session: Not on file  . Stress: Not on file  Relationships  . Social connections:    Talks on phone: Not on file    Gets together: Not on file    Attends religious service: Not on file    Active member of club or organization: Not on file    Attends meetings of clubs or organizations: Not on file    Relationship status: Not on file  . Intimate partner violence:    Fear of current or ex partner: Not on file    Emotionally abused: Not on file    Physically abused: Not on file    Forced sexual activity: Not on file  Other Topics Concern  . Not on file  Social History Narrative  . Not on file    Past Surgical History:  Procedure Laterality Date  . CYSTOSCOPY    . orciectomy     right    Immunization History  Administered Date(s) Administered  . Influenza,inj,Quad PF,6+ Mos 08/30/2016, 08/14/2017  . Tdap 08/30/2016    Current Meds  Medication Sig  . Cyanocobalamin (VITAMIN B 12 PO) Take 1 tablet by mouth daily.  Marland Kitchen  Glucosamine-Chondroitin-MSM (CONDROLITE PO) Take 1 tablet by mouth daily.    No Known Allergies  BP 118/60 (BP Location: Left Arm, Patient Position: Sitting, Cuff Size: Large)   Pulse 80   Temp 98.2 F (36.8 C) (Oral)   Ht 5\' 7"  (1.702 m)   Wt 199 lb (90.3 kg)   SpO2 100%   BMI 31.17 kg/m   Review of Systems  Constitutional: Negative.   HENT: Positive for mouth sores (Fever Blisters on lower lip).   Eyes: Negative.   Respiratory: Negative.   Cardiovascular: Negative.   Gastrointestinal: Negative.   Endocrine: Negative.   Genitourinary: Negative.   Musculoskeletal: Negative.   Skin: Negative.   Allergic/Immunologic: Negative.   Neurological: Negative.   Hematological: Negative.    Objective:   Physical Exam  Constitutional: He appears well-developed and well-nourished.  HENT:  Head: Normocephalic and atraumatic.  Right Ear: External ear normal.  Left Ear:  External ear normal.  Nose: Nose normal.  Fever blisters on lower lip  Eyes: Pupils are equal, round, and reactive to light. Conjunctivae and EOM are normal.  Neck: Normal range of motion. Neck supple.  Cardiovascular: Normal rate, regular rhythm and intact distal pulses.  Pulmonary/Chest: Effort normal and breath sounds normal.  Abdominal: Soft. Bowel sounds are normal.  Musculoskeletal: Normal range of motion.  Neurological: He is alert.  Skin: Skin is warm and dry.  Nursing note and vitals reviewed.  Assessment & Plan:   1. Screening for diabetes mellitus Hgb A1c is normal at 5.1 today. Urinalysis reveals mild dehydration. He will increase fluids.  - POCT glycosylated hemoglobin (Hb A1C) - POCT urinalysis dipstick  2. Fever blister - valACYclovir (VALTREX) 500 MG tablet; Take 1 tablet (500 mg total) by mouth daily.  Dispense: 30 tablet; Refill: 2  3. Insomnia, unspecified type We will initiate Trazodone today.  - traZODone (DESYREL) 50 MG tablet; Take 0.5-1 tablets (25-50 mg total) by mouth at bedtime as needed for sleep.  Dispense: 30 tablet; Refill: 2  4. Follow up He will follow up in 6 months.   Meds ordered this encounter  Medications  . valACYclovir (VALTREX) 500 MG tablet    Sig: Take 1 tablet (500 mg total) by mouth daily.    Dispense:  30 tablet    Refill:  2  . traZODone (DESYREL) 50 MG tablet    Sig: Take 0.5-1 tablets (25-50 mg total) by mouth at bedtime as needed for sleep.    Dispense:  30 tablet    Refill:  [place],  MSN, FNP-C [facility] [address] [place], [place] [address] [phone]

## 2018-05-28 NOTE — Patient Instructions (Addendum)
Valacyclovir caplets Qu es este medicamento? El VALACICLOVIR es un medicamento antiviral. [place] para tratar o prevenir infecciones causadas por ciertos tipos de virus. Algunos ejemplos de estas infecciones incluyen herpes y culebrilla (herpes zster). Este medicamento no cura los herpes. Este medicamento puede ser utilizado para otros usos; si tiene alguna pregunta consulte con su proveedor de atencin mdica o con su farmacutico. MARCAS COMUNES: Valtrex Qu le debo informar a mi profesional de la salud antes de tomar este medicamento? Necesita saber si usted presenta alguno de los siguientes problemas o situaciones: -sndrome de inmunodeficiencia adquirida (SIDA) -cualquier otra enfermedad que puede debilitar el sistema inmunolgico -transplante de mdula sea o rin -enfermedad renal -una reaccin alrgica o inusual al valaciclovir, aciclovir, ganciclovir, valganciclovir, a otros medicamentos, alimentos, colorantes o conservantes -si est embarazada o buscando quedar embarazada -si est amamantando a un beb Cmo debo utilizar este medicamento? Tome este medicamento por va oral con un vaso de agua. Siga las instrucciones de la etiqueta del [place]. Este medicamento se puede tomar con o sin alimentos. Tome sus dosis a intervalos regulares. No tome su medicamento con una frecuencia mayor a la indicada. Complete todo el tratamiento con el medicamento segn lo haya recetado su mdico o su profesional de la salud aun si considera que su problema ha mejorado. No deje de tomarlo excepto si as lo indica su mdico o su profesional de [employer]	[place]. Hable con su pediatra para informarse acerca del uso de este medicamento en nios. Aunque este medicamento ha sido recetado a nios tan menores como de 2 aos de edad para condiciones selectivas, las precauciones se aplican. Sobredosis: Pngase en contacto inmediatamente con un centro toxicolgico o una sala de urgencia si usted cree que haya tomado  demasiado medicamento. ATENCIN: [employer] es solo para usted. No comparta este medicamento con nadie. Qu sucede si me olvido de una dosis? Si olvida una dosis, tmela lo antes posible. Si es casi la hora de la prxima dosis, tome slo esa dosis. No tome dosis adicionales o dobles. Qu puede interactuar con este medicamento? -cimetidina -probenecid Puede ser que esta lista no menciona todas las posibles interacciones. Informe a su profesional de KB Home	[place] de [employer] productos a base de hierbas, medicamentos de [place] o suplementos nutritivos que est tomando. Si usted fuma, consume bebidas alcohlicas o si utiliza drogas ilegales, indqueselo tambin a su profesional de [employer]	[place]. Algunas sustancias pueden interactuar con su medicamento. A qu debo estar atento al usar Coca[employer]? Si los sntomas no mejoran despus de 1 semana, informe a su mdico o a su profesional de [employer]	[place]. Este medicamento es ms eficaz cuando se lo empieza a tomar en cuanto comienza la infeccin, durante las primeras 72 horas. Comience el tratamiento tan pronto como le sea posible despus de observar los primeros signos de infeccin como, hormigueo, picazn o dolor en la zona afectada. Es posible transmitir el herpes genital aun cuando no tiene sntomas. Siempre sigue prcticas de sexo seguro como usar preservativos de ltex o poliuretano cuando tiene contacto sexual. Debe mantenerse bien hidrato mientras est tomando este medicamento. Beba lquido en abundancia. Qu efectos secundarios puedo tener al [employer] este medicamento? Efectos secundarios que debe informar a su mdico o a Barrister's clerk de la salud tan pronto como sea posible: -Chief of Staff como erupcin cutnea, picazn o urticarias, hinchazn de la cara, labios o lengua -comportamiento agresivo -confusin -alucinaciones -problemas de coordinacin, del habla, al caminar -dolor estomacal -temblor -dificultad para orinar o cambios  en el volumen de orina Efectos secundarios que, por lo general, no requieren atencin mdica (debe informarlos a su mdico o a Barrister's clerk de la salud si persisten o si son molestos): -mareos -dolor de cabeza -nuseas, vmito Puede ser que esta lista no menciona todos los posibles efectos secundarios. Comunquese a su mdico por asesoramiento mdico [employer]. Usted puede informar los efectos secundarios a la FDA por telfono al 1-800-FDA-1088. Dnde debo guardar mi medicina? Mantngala fuera del alcance de los nios. Gurdela a [employer], entre 15 y 30 grados C (91 y 88 grados F). Mantenga el envase bien cerrado. Deseche todo el medicamento que no haya utilizado, despus de la fecha de vencimiento. ATENCIN: Este folleto es un resumen. Puede ser que no cubra toda la posible informacin. Si usted tiene preguntas acerca de esta medicina, consulte con su mdico, su farmacutico o su profesional de Technical sales engineer.  2018 Elsevier/Gold Standard (2016-11-30 00:00:00) Trazodone extended release oral tablets Qu es este medicamento? La TRAZODONA se utiliza para tratar la depresin. Este medicamento puede ser utilizado para otros usos; si tiene alguna pregunta consulte con su proveedor de atencin mdica o con su farmacutico. MARCAS COMUNES: [place] le debo informar a mi profesional de la salud antes de tomar este medicamento? Necesita saber si usted presenta alguno de los siguientes problemas o situaciones: intento de suicidio o con ideas suicidas trastorno bipolar problemas sanguneos glaucoma enfermedad cardiaca o ataque cardiaco previo latidos cardiacos irregulares enfermedad renal enfermedad heptica niveles bajos de sodio en la sangre una reaccin alrgica o inusual a la trazodona, a otros medicamentos, alimentos, colorantes o conservantes si est embarazada o buscando quedar embarazada si est amamantando a un beb Cmo debo [employer]? Tome este  medicamento por va oral con un vaso de agua. Siga las instrucciones de la etiqueta del [place]. Tome este medicamento con el estmago vaco, al menos 30 minutos antes o 2 horas despus de comer. No lo tome con alimentos. No triture ni [employer]. Puede partir por la mitad por la lnea [place]. Tome su medicamento a la hora de [employer]. No tome su medicamento con una frecuencia mayor a la indicada. No deje de tomar Coca[employer]a de repente a menos que as lo indique su mdico. Dejar de Insurance account manager medicamento demasiado rpido puede causar efectos secundarios graves o podra empeorar su afeccin. Su farmacutico le dar una [place] del medicamento especial (MedGuide, nombre en ingls) con cada receta y en cada ocasin que la vuelva a surtir. Asegrese de leer esta informacin cada vez cuidadosamente. Hable con su pediatra para informarse acerca del uso de este medicamento en nios. Puede requerir atencin especial. Sobredosis: Pngase en contacto inmediatamente con un centro toxicolgico o una sala de urgencia si usted cree que haya tomado demasiado medicamento. ATENCIN: [employer] es solo para usted. No comparta este medicamento con nadie. Qu sucede si me olvido de una dosis? Si olvida una dosis, tmela lo antes posible. Si es casi la hora de la prxima dosis, tome slo esa dosis. No tome dosis adicionales o dobles. Qu puede interactuar con este medicamento? No tome esta medicina con ninguno de los siguientes medicamentos: ciertos medicamentos para infecciones micticas, tales como fluconazol, quetoconazol, itraconazol, posaconazol, voriconazol cisapride dofetilida dronedarona linezolid IMAOs, tales como Carbex, Eldepryl, Marplan, Nardil y Parnate mesoridazina azul de metileno (va intravenosa) pimozida saquinavir tioridazina ziprasidona Esta medicina tambin puede interactuar con los siguientes medicamentos: alcohol medicamentos antivricos para el VIH o  [place]  aspirina o medicamentos tipo aspirina barbitricos tales como el fenobarbital ciertos medicamentos para la presin sangunea, enfermedad cardiaca, pulso cardiaco irregular ciertos medicamentos para la depresin, ansiedad o trastornos psicticos ciertos medicamentos para las migraas, tales como almotriptn, eletriptn, frovatriptn, naratriptn, rizatriptn, sumatriptn, zolmitriptn ciertos medicamentos para convulsiones, tales como carbamazepina y fenitona ciertos medicamento para conciliar el sueo ciertos medicamentos que tratan o previenen cogulos sanguneos, como dalteparina, enoxaparina, warfarina digoxina fentanilo litio los [place], medicamentos para el dolor o inflamacin, como ibuprofeno o naproxeno otros medicamentos que prolongan el intervalo QT (causa un ritmo cardiaco anormal) rasagilina medicamentos a base de hierbas que contienen kava kava, hierba de [place] o valeriana tramadol triptfano Puede ser que esta lista no menciona todas las posibles interacciones. Informe a su profesional de KB Home	[place] de [employer] productos a base de hierbas, medicamentos de [place] o suplementos nutritivos que est tomando. Si usted fuma, consume bebidas alcohlicas o si utiliza drogas ilegales, indqueselo tambin a su profesional de KB Home	[place]. Algunas sustancias pueden interactuar con su medicamento. A qu debo estar atento al usar Coca[employer]? Informe a su mdico si sus sntomas no mejoran o si empeoran. Visite a su mdico o a su profesional de la salud para chequear su evolucin peridicamente. Debido que puede ser necesario tomar este medicamento durante varias semanas para que sea posible observar sus efectos en forma [place], es importante que sigue su tratamiento como recetado por su mdico. Los pacientes y sus familias deben estar atentos si empeora la depresin o ideas suicidas. Tambin est atento a cambios repentinos o severos de emocin, tales como el sentirse ansioso, agitado, lleno de  pnico, irritable, hostil, agresivo, impulsivo, inquietud severa, demasiado excitado e hiperactivo o dificultad para conciliar el sueo. Si esto ocurre, especialmente al comenzar con el tratamiento o al cambiar de dosis, comunquese con su profesional de [employer]	[place]. Puede experimentar somnolencia, mareos o visin borrosa. No conduzca ni utilice maquinaria, ni haga nada que Associate Professor en estado de alerta hasta que sepa cmo le afecta este medicamento. No se siente ni se ponga de pie con rapidez, especialmente si es un paciente de edad avanzada. Esto reduce el riesgo de mareos o [employer]. El alcohol puede interferir con el efecto de este medicamento. Evite consumir bebidas alcohlicas. Este medicamento puede provocar sequedad de los ojos y visin borrosa. [place] lentes de contacto, puede sentir [employer]. Las gotas lubricantes pueden ayudarle. Si el problema no desaparece o es severo, visite a su mdico de ojos. Se le podr secar la boca. Masticar chicle sin azcar, chupar caramelos duros y tomar agua en abundancia le ayudar a mantener la boca hmeda. Si el problema no desaparece o es severo, consulte a su mdico. Qu efectos secundarios puedo tener al [employer] este medicamento? Efectos secundarios que debe informar a su mdico o a Barrister's clerk de la salud tan pronto como sea posible: Chief of Staff, como erupcin cutnea, comezn/picazn o urticarias, e hinchazn de la cara, los labios o la lengua estado de nimo elevado, menor necesidad de dormir, pensamientos acelerados, conducta impulsiva confusin ritmo cardiaco rpido, irregular sensacin de desmayos o aturdimiento, cadas sensacin de agitacin, enojo o irritabilidad prdida de equilibrio o coordinacin ereccin dolorosa o prolongada inquietud, caminar de un lado a otro, incapacidad para quedarse quieto ideas suicidas u otros cambios en el estado de nimo temblores dificultad para conciliar el sueo convulsiones sangrado o  moretones inusuales Efectos secundarios que generalmente no requieren atencin mdica (infrmelos a su mdico o a Barrister's clerk  de la salud si persisten o si son molestos): cambios en el deseo o desempeo sexual cambios en el apetito o el peso estreimiento dolor de cabeza dolores musculares nuseas Puede ser que esta lista no menciona todos los posibles efectos secundarios. Comunquese a su mdico por asesoramiento mdico [employer]. Usted puede informar los efectos secundarios a la FDA por telfono al 1-800-FDA-1088. Dnde debo guardar mi medicina? Mantngala fuera del alcance de los nios. Gurdela a [employer], entre 15 y 73 grados C (53 y 12 grados F). Protjala de la luz. Mantenga el envase bien cerrado. Deseche los medicamentos que no haya utilizado, despus de la fecha de vencimiento. ATENCIN: Este folleto es un resumen. Puede ser que no cubra toda la posible informacin. Si usted tiene preguntas acerca de esta medicina, consulte con su mdico, su farmacutico o su profesional de Technical sales engineer.  2018 Elsevier/Gold Standard (2016-11-30 00:00:00)

## 2018-06-24 ENCOUNTER — Ambulatory Visit: Payer: Self-pay | Attending: Family Medicine

## 2018-07-02 ENCOUNTER — Other Ambulatory Visit: Payer: Self-pay

## 2018-07-02 ENCOUNTER — Emergency Department (HOSPITAL_COMMUNITY): Payer: Self-pay

## 2018-07-02 ENCOUNTER — Encounter (HOSPITAL_COMMUNITY): Payer: Self-pay | Admitting: Emergency Medicine

## 2018-07-02 ENCOUNTER — Emergency Department (HOSPITAL_COMMUNITY)
Admission: EM | Admit: 2018-07-02 | Discharge: 2018-07-02 | Disposition: A | Discharge status: Home / Self Care | Payer: Self-pay | Attending: Emergency Medicine | Admitting: Emergency Medicine

## 2018-07-02 DIAGNOSIS — S8251XA Displaced fracture of medial malleolus of right tibia, initial encounter for closed fracture: Secondary | ICD-10-CM | POA: Insufficient documentation

## 2018-07-02 DIAGNOSIS — Y999 Unspecified external cause status: Secondary | ICD-10-CM | POA: Insufficient documentation

## 2018-07-02 DIAGNOSIS — S82391A Other fracture of lower end of right tibia, initial encounter for closed fracture: Secondary | ICD-10-CM

## 2018-07-02 DIAGNOSIS — Z8547 Personal history of malignant neoplasm of testis: Secondary | ICD-10-CM | POA: Insufficient documentation

## 2018-07-02 DIAGNOSIS — S82444A Nondisplaced spiral fracture of shaft of right fibula, initial encounter for closed fracture: Secondary | ICD-10-CM | POA: Insufficient documentation

## 2018-07-02 DIAGNOSIS — Y9351 Activity, roller skating (inline) and skateboarding: Secondary | ICD-10-CM | POA: Insufficient documentation

## 2018-07-02 DIAGNOSIS — S82891A Other fracture of right lower leg, initial encounter for closed fracture: Secondary | ICD-10-CM

## 2018-07-02 DIAGNOSIS — X500XXA Overexertion from strenuous movement or load, initial encounter: Secondary | ICD-10-CM | POA: Insufficient documentation

## 2018-07-02 DIAGNOSIS — Z79899 Other long term (current) drug therapy: Secondary | ICD-10-CM | POA: Insufficient documentation

## 2018-07-02 DIAGNOSIS — Y929 Unspecified place or not applicable: Secondary | ICD-10-CM | POA: Insufficient documentation

## 2018-07-02 HISTORY — DX: Other fracture of right lower leg, initial encounter for closed fracture: S82.891A

## 2018-07-02 MED ORDER — OXYCODONE-ACETAMINOPHEN 5-325 MG PO TABS
1.0000 | ORAL_TABLET | Freq: Once | ORAL | Status: AC
  Administered 2018-07-02: 1 via ORAL
  Filled 2018-07-02: qty 1

## 2018-07-02 MED ORDER — OXYCODONE-ACETAMINOPHEN 5-325 MG PO TABS: 1.0000 | ORAL_TABLET | Freq: Four times a day (QID) | ORAL | 0 refills | Status: DC | PRN

## 2018-07-02 MED ORDER — OXYCODONE-ACETAMINOPHEN 5-325 MG PO TABS
1.0000 | ORAL_TABLET | ORAL | Status: DC | PRN
  Administered 2018-07-02: 1 via ORAL
  Filled 2018-07-02: qty 1

## 2018-07-02 NOTE — ED Provider Notes (Signed)
[facility] EMERGENCY DEPARTMENT Provider Note   CSN: [id number] Arrival date & time: 07/02/18  1527     History   Chief Complaint Chief Complaint  Patient presents with  . Ankle Pain    HPI Keith Price is a 37 y.o. male.  HPI   Keith Price is a 37yo male with past medical department for evaluation of right ankle injury.  Patient reports that he was rollerskating and accidentally lost his footing and twisted his right ankle inward, falling to the ground.  He heard a cracking noise with the fall.  He denies hitting his head or loss of consciousness.  Reports that he has severe pain around the entire ankle joint.  Did not take any medications prior to arrival.  Is unable to weight-bear due to pain.  He denies numbness, weakness, open wounds, arthralgias elsewhere.  Does not take blood thinners.  Last ate around 12 PM.  Past Medical History:  Diagnosis Date  . Anemia   . 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 Testicular cancer ([place]) 04/05/2010    Patient Active Problem List   Diagnosis Date Noted  . Testicular cancer ([place]) 04/05/2010  . 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 ABDOMINAL MASS 03/16/2010    Past Surgical History:  Procedure Laterality Date  . CYSTOSCOPY    . orciectomy     right        Home Medications    Prior to Admission medications   Medication Sig Start Date End Date Taking? Authorizing Provider  traZODone (DESYREL) 50 MG tablet Take 0.5-1 tablets (25-50 mg total) by mouth at bedtime as needed for sleep. 05/28/18  Yes Azzie Glatter, FNP  valACYclovir (VALTREX) 500 MG tablet Take 1 tablet (500 mg total) by mouth daily. 05/28/18  Yes Azzie Glatter, FNP    Family History Family History  Problem Relation Age of Onset  . Diabetes Father   . Diabetes Sister   . Diabetes Brother     Social History Social History   Tobacco Use  . Smoking status: Never Smoker  . Smokeless tobacco: Never Used    Substance Use Topics  . Alcohol use: No  . Drug use: No     Allergies   Patient has no known allergies.   Review of Systems Review of Systems  Constitutional: Negative for chills and fever.  Musculoskeletal: Positive for arthralgias (right ankle) and joint swelling (right ankle).  Skin: Negative for color change and wound.  Neurological: Negative for weakness, numbness and headaches.     Physical Exam Updated Vital Signs BP 115/66 (BP Location: Left Arm)   Pulse 78   Temp 99 F (37.2 C) (Oral)   Resp 14   Ht 5\' 7"  (1.702 m)   Wt 89.4 kg   SpO2 100%   BMI 30.85 kg/m   Physical Exam  Constitutional: He appears well-developed and well-nourished. No distress.  NAD  HENT:  Head: Normocephalic and atraumatic.  Eyes: Right eye exhibits no discharge. Left eye exhibits no discharge.  Pulmonary/Chest: Effort normal. No respiratory distress.  Musculoskeletal:  Right ankle grossly swollen with tenderness to palpation over medial and lateral malleolus. Limited dorsiflexion/plantarflexion due to pain. No erythema, ecchymosis, or deformity appreciated. No break in skin. Achilles intact. Good pedal pulse and cap refill of toes. Compartments soft. Sensation to light touch intact distal to ankle joint.   Neurological: He is alert. Coordination normal.  Skin: Skin is warm and dry. Capillary refill  takes less than 2 seconds. He is not diaphoretic.  Psychiatric: He has a normal mood and affect. His behavior is normal.  Nursing note and vitals reviewed.    ED Treatments / Results  Labs (all labs ordered are listed, but only abnormal results are displayed) Labs Reviewed - No data to display  EKG None  Radiology Dg Ankle Complete Right  Result Date: 07/02/2018 CLINICAL DATA:  Right ankle pain since a twisting injury earlier today. Initial encounter. EXAM: RIGHT ANKLE - COMPLETE 3+ VIEW COMPARISON:  None. FINDINGS: The patient has an acute, mildly distracted spiral fracture of  the fibula originating in the diaphysis approximately 14 cm above the tip of the lateral malleolus. The medial clear space is widened. Soft tissue swelling is seen about the lateral aspect of the ankle. 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 Electronically Signed   By: Inge Rise M.D.   On: 07/02/2018 17:04   Dg Foot Complete Right  Result Date: 07/02/2018 CLINICAL DATA:  Right foot pain since a twisting injury earlier today. Initial encounter. EXAM: RIGHT FOOT COMPLETE - 3+ VIEW COMPARISON:  None. FINDINGS: Distal fibular fracture is noted as seen on prior dedicated plain films of the ankle. No other bony or joint abnormality is identified. IMPRESSION: Negative for foot fracture. Distal fibular fracture as seen on dedicated plain films of the ankle this same day. Electronically Signed   By: Inge Rise M.D.   On: 07/02/2018 17:05    Procedures Procedures (including critical care time)  Medications Ordered in ED Medications  oxyCODONE-acetaminophen (PERCOCET/ROXICET) 5-325 MG per tablet 1 tablet (1 tablet Oral Given 07/02/18 1901)     Initial Impression / Assessment and Plan / ED Course  I have reviewed the triage vital signs and the nursing notes.  Pertinent labs & imaging results that were available during my care of the patient were reviewed by me and considered in my medical decision making (see chart for details).     X-ray right ankle reveals nondisplaced spiral fracture of the right distal fibula and posterior malleolar fracture of distal right tibia as well as widening of the medial clear space.  On exam foot is neurovascularly intact and compartments soft. I discussed this patient with Orthopedic on call Dr. Griffin Basil who would like patient placed in posterior splint with stirrup and to follow-up in his office next week for planned surgical  intervention.  Splint placed in the ED and patient counseled on use of crutches.  Patient will be discharged home with pain medication and counseled on RICE protocol.  Discussed reasons to return to the emergency department and he agrees and appears reliable.  Final Clinical Impressions(s) / ED Diagnoses   Final diagnoses:  Closed nondisplaced spiral fracture of shaft of right fibula, initial encounter  Closed fracture of posterior malleolus of right tibia, initial encounter    ED Discharge Orders         Ordered    oxyCODONE-acetaminophen (PERCOCET/ROXICET) 5-325 MG tablet  Every 6 hours PRN     07/02/18 1952           Glyn Ade, PA-C 07/03/18 0131    Lacretia Leigh, MD 07/04/18 1105

## 2018-07-02 NOTE — ED Triage Notes (Signed)
Pt reports he was roller skating and he fell, he heard a "crack" and his foot twisted, complaining of R ankle pain,

## 2018-07-02 NOTE — ED Notes (Signed)
Patient transported to X-ray 

## 2018-07-02 NOTE — Discharge Instructions (Addendum)
You have a broken ankle. Wear the splint until you are seen by the bone doctor next week. Please call and make an appointment at Dr. Rich Fuchs office (information listed below.)   I have written you a prescription for pain medicine. Please take this every 6hrs as needed for pain. It can make you drowsy so please do not drive or work while taking it.  Elevate the ankle when you can.  Use the crutches and do not apply any weight to the foot. Return to the ER if you have any new or concerning symptoms like numbness in the foot.

## 2018-07-04 ENCOUNTER — Other Ambulatory Visit: Payer: Self-pay

## 2018-07-04 ENCOUNTER — Encounter (HOSPITAL_BASED_OUTPATIENT_CLINIC_OR_DEPARTMENT_OTHER): Payer: Self-pay | Admitting: *Deleted

## 2018-07-04 NOTE — Pre-Procedure Instructions (Signed)
Eddie Dibbles will be interpreter for pt., per Bethena Roys at [place] for [facility]; please call [phone] if surgery time changes.

## 2018-07-07 ENCOUNTER — Encounter (HOSPITAL_BASED_OUTPATIENT_CLINIC_OR_DEPARTMENT_OTHER): Payer: Self-pay | Admitting: Anesthesiology

## 2018-07-07 NOTE — Anesthesia Preprocedure Evaluation (Addendum)
Anesthesia Evaluation  Patient identified by MRN, date of birth, ID band Patient awake    Reviewed: Allergy & Precautions, NPO status , Patient's Chart, lab work & pertinent test results  Airway Mallampati: II  TM Distance: >3 FB Neck ROM: Full    Dental no notable dental hx.    Pulmonary neg pulmonary ROS,    Pulmonary exam normal breath sounds clear to auscultation       Cardiovascular negative cardio ROS Normal cardiovascular exam Rhythm:Regular Rate:Normal     Neuro/Psych negative neurological ROS  negative psych ROS   GI/Hepatic negative GI ROS, Neg liver ROS,   Endo/Other  negative endocrine ROS  Renal/GU negative Renal ROS  negative genitourinary   Musculoskeletal negative musculoskeletal ROS (+)   Abdominal   Peds negative pediatric ROS (+)  Hematology negative hematology ROS (+)   Anesthesia Other Findings Hx of testicular cancer  Reproductive/Obstetrics negative OB ROS                            Anesthesia Physical Anesthesia Plan  ASA: II  Anesthesia Plan: General   Post-op Pain Management:  Regional for Post-op pain   Induction: Intravenous  PONV Risk Score and Plan: 2 and Ondansetron and Midazolam  Airway Management Planned: LMA  Additional Equipment:   Intra-op Plan:   Post-operative Plan: Extubation in OR  Informed Consent: I have reviewed the patients History and Physical, chart, labs and discussed the procedure including the risks, benefits and alternatives for the proposed anesthesia with the patient or authorized representative who has indicated his/her understanding and acceptance.   Dental advisory given  Plan Discussed with: CRNA  Anesthesia Plan Comments:        Anesthesia Quick Evaluation

## 2018-07-08 ENCOUNTER — Encounter (HOSPITAL_BASED_OUTPATIENT_CLINIC_OR_DEPARTMENT_OTHER): Payer: Self-pay

## 2018-07-08 ENCOUNTER — Ambulatory Visit (HOSPITAL_BASED_OUTPATIENT_CLINIC_OR_DEPARTMENT_OTHER): Payer: Self-pay | Admitting: Anesthesiology

## 2018-07-08 ENCOUNTER — Other Ambulatory Visit: Payer: Self-pay

## 2018-07-08 ENCOUNTER — Encounter (HOSPITAL_BASED_OUTPATIENT_CLINIC_OR_DEPARTMENT_OTHER)
Admission: RE | Disposition: A | Discharge status: Home / Self Care | Payer: Self-pay | Source: Ambulatory Visit | Attending: Orthopaedic Surgery

## 2018-07-08 ENCOUNTER — Ambulatory Visit (HOSPITAL_BASED_OUTPATIENT_CLINIC_OR_DEPARTMENT_OTHER)
Admission: RE | Admit: 2018-07-08 | Discharge: 2018-07-08 | Disposition: A | Discharge status: Home / Self Care | Payer: Self-pay | Source: Ambulatory Visit | Attending: Orthopaedic Surgery | Admitting: Orthopaedic Surgery

## 2018-07-08 DIAGNOSIS — Z9221 Personal history of antineoplastic chemotherapy: Secondary | ICD-10-CM | POA: Insufficient documentation

## 2018-07-08 DIAGNOSIS — S93431A Sprain of tibiofibular ligament of right ankle, initial encounter: Secondary | ICD-10-CM | POA: Insufficient documentation

## 2018-07-08 DIAGNOSIS — Z8547 Personal history of malignant neoplasm of testis: Secondary | ICD-10-CM | POA: Insufficient documentation

## 2018-07-08 DIAGNOSIS — S82841A Displaced bimalleolar fracture of right lower leg, initial encounter for closed fracture: Secondary | ICD-10-CM | POA: Insufficient documentation

## 2018-07-08 DIAGNOSIS — W1830XA Fall on same level, unspecified, initial encounter: Secondary | ICD-10-CM | POA: Insufficient documentation

## 2018-07-08 DIAGNOSIS — Z79899 Other long term (current) drug therapy: Secondary | ICD-10-CM | POA: Insufficient documentation

## 2018-07-08 HISTORY — PX: SYNDESMOSIS REPAIR: SHX5182

## 2018-07-08 HISTORY — PX: ORIF ANKLE FRACTURE: SHX5408

## 2018-07-08 HISTORY — DX: Other fracture of right lower leg, initial encounter for closed fracture: S82.891A

## 2018-07-08 HISTORY — DX: Personal history of malignant neoplasm of testis: Z85.47

## 2018-07-08 HISTORY — DX: Personal history of antineoplastic chemotherapy: Z92.21

## 2018-07-08 HISTORY — DX: Herpesviral vesicular dermatitis: B00.1

## 2018-07-08 SURGERY — OPEN REDUCTION INTERNAL FIXATION (ORIF) ANKLE FRACTURE
Anesthesia: General | Site: Ankle | Laterality: Right

## 2018-07-08 MED ORDER — MEPERIDINE HCL 25 MG/ML IJ SOLN: 6.2500 mg | INTRAMUSCULAR | Status: DC | PRN

## 2018-07-08 MED ORDER — CHLORHEXIDINE GLUCONATE 4 % EX LIQD: 60.0000 mL | Freq: Once | CUTANEOUS | Status: DC

## 2018-07-08 MED ORDER — PROMETHAZINE HCL 25 MG/ML IJ SOLN: 6.2500 mg | INTRAMUSCULAR | Status: DC | PRN

## 2018-07-08 MED ORDER — CEFAZOLIN SODIUM-DEXTROSE 2-4 GM/100ML-% IV SOLN
INTRAVENOUS | Status: AC
  Filled 2018-07-08: qty 100

## 2018-07-08 MED ORDER — DEXAMETHASONE SODIUM PHOSPHATE 10 MG/ML IJ SOLN
INTRAMUSCULAR | Status: DC | PRN
  Administered 2018-07-08: 10 mg via INTRAVENOUS

## 2018-07-08 MED ORDER — ONDANSETRON HCL 4 MG/2ML IJ SOLN
INTRAMUSCULAR | Status: AC
  Filled 2018-07-08: qty 2

## 2018-07-08 MED ORDER — LIDOCAINE 2% (20 MG/ML) 5 ML SYRINGE
INTRAMUSCULAR | Status: AC
  Filled 2018-07-08: qty 5

## 2018-07-08 MED ORDER — MELOXICAM 7.5 MG PO TABS: 7.5000 mg | ORAL_TABLET | Freq: Every day | ORAL | 2 refills | Status: DC

## 2018-07-08 MED ORDER — PROPOFOL 10 MG/ML IV BOLUS
INTRAVENOUS | Status: DC | PRN
  Administered 2018-07-08: 200 mg via INTRAVENOUS

## 2018-07-08 MED ORDER — HYDROMORPHONE HCL 1 MG/ML IJ SOLN: 0.2500 mg | INTRAMUSCULAR | Status: DC | PRN

## 2018-07-08 MED ORDER — ONDANSETRON HCL 4 MG/2ML IJ SOLN: INTRAMUSCULAR | Status: DC | PRN

## 2018-07-08 MED ORDER — ONDANSETRON HCL 4 MG PO TABS: 4.0000 mg | ORAL_TABLET | Freq: Three times a day (TID) | ORAL | 1 refills | Status: AC | PRN

## 2018-07-08 MED ORDER — MIDAZOLAM HCL 2 MG/2ML IJ SOLN
INTRAMUSCULAR | Status: AC
  Filled 2018-07-08: qty 2

## 2018-07-08 MED ORDER — EPHEDRINE SULFATE 50 MG/ML IJ SOLN
INTRAMUSCULAR | Status: DC | PRN
  Administered 2018-07-08: 10 mg via INTRAVENOUS

## 2018-07-08 MED ORDER — LIDOCAINE HCL (CARDIAC) PF 100 MG/5ML IV SOSY
PREFILLED_SYRINGE | INTRAVENOUS | Status: DC | PRN
  Administered 2018-07-08: 80 mg via INTRAVENOUS

## 2018-07-08 MED ORDER — OXYCODONE HCL 5 MG PO TABS: ORAL_TABLET | ORAL | 0 refills | Status: AC

## 2018-07-08 MED ORDER — LACTATED RINGERS IV SOLN
INTRAVENOUS | Status: DC
  Administered 2018-07-08: 08:00:00 via INTRAVENOUS

## 2018-07-08 MED ORDER — OXYCODONE HCL 5 MG/5ML PO SOLN: 5.0000 mg | Freq: Once | ORAL | Status: DC | PRN

## 2018-07-08 MED ORDER — FENTANYL CITRATE (PF) 100 MCG/2ML IJ SOLN
INTRAMUSCULAR | Status: AC
  Filled 2018-07-08: qty 2

## 2018-07-08 MED ORDER — SCOPOLAMINE 1 MG/3DAYS TD PT72: 1.0000 | MEDICATED_PATCH | Freq: Once | TRANSDERMAL | Status: DC | PRN

## 2018-07-08 MED ORDER — ASPIRIN 81 MG PO TABS: 81.0000 mg | ORAL_TABLET | Freq: Every day | ORAL | 0 refills | Status: AC

## 2018-07-08 MED ORDER — ONDANSETRON HCL 4 MG/2ML IJ SOLN
INTRAMUSCULAR | Status: DC | PRN
  Administered 2018-07-08: 4 mg via INTRAVENOUS

## 2018-07-08 MED ORDER — VANCOMYCIN HCL 1000 MG IV SOLR
INTRAVENOUS | Status: DC | PRN
  Administered 2018-07-08: 1000 mg via TOPICAL

## 2018-07-08 MED ORDER — MIDAZOLAM HCL 2 MG/2ML IJ SOLN
1.0000 mg | INTRAMUSCULAR | Status: DC | PRN
  Administered 2018-07-08 (×2): 2 mg via INTRAVENOUS

## 2018-07-08 MED ORDER — CEFAZOLIN SODIUM-DEXTROSE 2-3 GM-%(50ML) IV SOLR
INTRAVENOUS | Status: DC | PRN
  Administered 2018-07-08: 2 g via INTRAVENOUS

## 2018-07-08 MED ORDER — OMEPRAZOLE 20 MG PO CPDR: 20.0000 mg | DELAYED_RELEASE_CAPSULE | Freq: Every day | ORAL | 0 refills | Status: DC

## 2018-07-08 MED ORDER — ACETAMINOPHEN 500 MG PO TABS: 1000.0000 mg | ORAL_TABLET | Freq: Three times a day (TID) | ORAL | 0 refills | Status: AC

## 2018-07-08 MED ORDER — CEFAZOLIN SODIUM-DEXTROSE 2-4 GM/100ML-% IV SOLN: 2.0000 g | INTRAVENOUS | Status: DC

## 2018-07-08 MED ORDER — PHENYLEPHRINE HCL 10 MG/ML IJ SOLN
INTRAMUSCULAR | Status: DC | PRN
  Administered 2018-07-08: 80 ug via INTRAVENOUS

## 2018-07-08 MED ORDER — PROPOFOL 10 MG/ML IV BOLUS
INTRAVENOUS | Status: AC
  Filled 2018-07-08: qty 40

## 2018-07-08 MED ORDER — OXYCODONE HCL 5 MG PO TABS: 5.0000 mg | ORAL_TABLET | Freq: Once | ORAL | Status: DC | PRN

## 2018-07-08 MED ORDER — FENTANYL CITRATE (PF) 100 MCG/2ML IJ SOLN
50.0000 ug | INTRAMUSCULAR | Status: DC | PRN
  Administered 2018-07-08: 50 ug via INTRAVENOUS
  Administered 2018-07-08: 100 ug via INTRAVENOUS

## 2018-07-08 MED ORDER — ROPIVACAINE HCL 5 MG/ML IJ SOLN
INTRAMUSCULAR | Status: DC | PRN
  Administered 2018-07-08: 50 mL via PERINEURAL

## 2018-07-08 MED ORDER — DEXAMETHASONE SODIUM PHOSPHATE 10 MG/ML IJ SOLN
INTRAMUSCULAR | Status: AC
  Filled 2018-07-08: qty 1

## 2018-07-08 MED ORDER — GLYCOPYRROLATE 0.2 MG/ML IJ SOLN
INTRAMUSCULAR | Status: DC | PRN
  Administered 2018-07-08: 0.2 mg via INTRAVENOUS

## 2018-07-08 SURGICAL SUPPLY — 106 items
APL SKNCLS STERI-STRIP NONHPOA (GAUZE/BANDAGES/DRESSINGS) ×2
BANDAGE ACE 4X5 VEL STRL LF (GAUZE/BANDAGES/DRESSINGS) ×4 IMPLANT
BANDAGE ACE 6X5 VEL STRL LF (GAUZE/BANDAGES/DRESSINGS) ×4 IMPLANT
BANDAGE ESMARK 6X9 LF (GAUZE/BANDAGES/DRESSINGS) IMPLANT
BENZOIN TINCTURE PRP APPL 2/3 (GAUZE/BANDAGES/DRESSINGS) ×4 IMPLANT
BIT DRILL 2 CANN GRADUATED (BIT) ×3 IMPLANT
BIT DRILL 2.5 CANN STRL (BIT) ×3 IMPLANT
BIT DRILL 2.7 (BIT) ×4
BIT DRILL 2.7X2.7/3XSCR ANKL (BIT) ×1 IMPLANT
BIT DRL 2.7X2.7/3XSCR ANKL (BIT) ×2
BLADE SURG 10 STRL SS (BLADE) ×4 IMPLANT
BLADE SURG 15 STRL LF DISP TIS (BLADE) ×4 IMPLANT
BLADE SURG 15 STRL SS (BLADE) ×8
BNDG CMPR 9X4 STRL LF SNTH (GAUZE/BANDAGES/DRESSINGS)
BNDG CMPR 9X6 STRL LF SNTH (GAUZE/BANDAGES/DRESSINGS)
BNDG COHESIVE 4X5 TAN STRL (GAUZE/BANDAGES/DRESSINGS) ×4 IMPLANT
BNDG ESMARK 4X9 LF (GAUZE/BANDAGES/DRESSINGS) IMPLANT
BNDG ESMARK 6X9 LF (GAUZE/BANDAGES/DRESSINGS)
CHLORAPREP W/TINT 26ML (MISCELLANEOUS) ×1 IMPLANT
CLEANER CAUTERY TIP 5X5 PAD (MISCELLANEOUS) ×2 IMPLANT
CLOSURE STERI-STRIP 1/2X4 (GAUZE/BANDAGES/DRESSINGS) ×2
CLOSURE WOUND 1/2 X4 (GAUZE/BANDAGES/DRESSINGS) ×1
CLSR STERI-STRIP ANTIMIC 1/2X4 (GAUZE/BANDAGES/DRESSINGS) ×4 IMPLANT
COVER BACK TABLE 60X90IN (DRAPES) ×4 IMPLANT
COVER MAYO STAND STRL (DRAPES) ×3 IMPLANT
CUFF TOURNIQUET SINGLE 34IN LL (TOURNIQUET CUFF) ×3 IMPLANT
DECANTER SPIKE VIAL GLASS SM (MISCELLANEOUS) IMPLANT
DRAPE EXTREMITY T 121X128X90 (DRAPE) ×4 IMPLANT
DRAPE IMP U-DRAPE 54X76 (DRAPES) ×4 IMPLANT
DRAPE OEC MINIVIEW 54X84 (DRAPES) ×4 IMPLANT
DRAPE U-SHAPE 47X51 STRL (DRAPES) ×4 IMPLANT
DRSG PAD ABDOMINAL 8X10 ST (GAUZE/BANDAGES/DRESSINGS) ×4 IMPLANT
DURAPREP 26ML APPLICATOR (WOUND CARE) ×6 IMPLANT
ELECT REM PT RETURN 9FT ADLT (ELECTROSURGICAL) ×4
ELECTRODE REM PT RTRN 9FT ADLT (ELECTROSURGICAL) ×2 IMPLANT
GAUZE SPONGE 4X4 12PLY STRL (GAUZE/BANDAGES/DRESSINGS) ×4 IMPLANT
GLOVE BIO SURGEON STRL SZ8 (GLOVE) ×6 IMPLANT
GLOVE BIOGEL PI IND STRL 7.0 (GLOVE) ×1 IMPLANT
GLOVE BIOGEL PI IND STRL 8 (GLOVE) ×3 IMPLANT
GLOVE BIOGEL PI INDICATOR 7.0 (GLOVE) ×2
GLOVE BIOGEL PI INDICATOR 8 (GLOVE) ×4
GLOVE ECLIPSE 6.5 STRL STRAW (GLOVE) ×3 IMPLANT
GLOVE ECLIPSE 8.0 STRL XLNG CF (GLOVE) ×4 IMPLANT
GLOVE SURG ORTHO 8.0 STRL STRW (GLOVE) ×1 IMPLANT
GOWN STRL REUS W/ TWL LRG LVL3 (GOWN DISPOSABLE) ×2 IMPLANT
GOWN STRL REUS W/TWL LRG LVL3 (GOWN DISPOSABLE) ×4
GOWN STRL REUS W/TWL XL LVL3 (GOWN DISPOSABLE) ×7 IMPLANT
IMPL TIGHTROP W/DRV K-LESS (Anchor) ×1 IMPLANT
IMPLANT TIGHTROPE W/DRV K-LESS (Anchor) ×4 IMPLANT
K-WIRE .062X4 (WIRE) IMPLANT
NDL HYPO 25X1 1.5 SAFETY (NEEDLE) IMPLANT
NDL MAYO TROCAR (NEEDLE) IMPLANT
NDL SUT 6 .5 CRC .975X.05 MAYO (NEEDLE) IMPLANT
NEEDLE HYPO 22GX1.5 SAFETY (NEEDLE) IMPLANT
NEEDLE HYPO 25X1 1.5 SAFETY (NEEDLE) IMPLANT
NEEDLE MAYO TAPER (NEEDLE)
NEEDLE MAYO TROCAR (NEEDLE) ×4 IMPLANT
NS IRRIG 1000ML POUR BTL (IV SOLUTION) ×4 IMPLANT
PACK BASIN DAY SURGERY FS (CUSTOM PROCEDURE TRAY) ×4 IMPLANT
PAD CAST 4YDX4 CTTN HI CHSV (CAST SUPPLIES) ×2 IMPLANT
PAD CLEANER CAUTERY TIP 5X5 (MISCELLANEOUS) ×2
PADDING CAST ABS 4INX4YD NS (CAST SUPPLIES)
PADDING CAST ABS COTTON 4X4 ST (CAST SUPPLIES) IMPLANT
PADDING CAST COTTON 4X4 STRL (CAST SUPPLIES) ×4
PADDING CAST COTTON 6X4 STRL (CAST SUPPLIES) ×4 IMPLANT
PASSER SUT SWANSON 36MM LOOP (INSTRUMENTS) IMPLANT
PENCIL BUTTON HOLSTER BLD 10FT (ELECTRODE) ×4 IMPLANT
PLATE 12H (Plate) ×3 IMPLANT
RETRIEVER SUT HEWSON (MISCELLANEOUS) IMPLANT
SCREW BN T10 FT 20X2.7XST CORT (Screw) ×2 IMPLANT
SCREW CANC T15 FT 16X4XST (Screw) ×2 IMPLANT
SCREW CANCELLOUS 4.0X16MM (Screw) ×8 IMPLANT
SCREW CANCELLOUS 4X14 (Screw) ×3 IMPLANT
SCREW CORT 2.7X20 (Screw) ×8 IMPLANT
SCREW LOW PROFILE 3.5X14 (Screw) ×9 IMPLANT
SHEET MEDIUM DRAPE 40X70 STRL (DRAPES) IMPLANT
SLEEVE SCD COMPRESS KNEE MED (MISCELLANEOUS) ×4 IMPLANT
SPLINT FAST PLASTER 5X30 (CAST SUPPLIES) ×40
SPLINT PLASTER CAST FAST 5X30 (CAST SUPPLIES) ×40 IMPLANT
SPONGE LAP 18X18 RF (DISPOSABLE) ×4 IMPLANT
STRIP CLOSURE SKIN 1/2X4 (GAUZE/BANDAGES/DRESSINGS) ×3 IMPLANT
SUCTION FRAZIER HANDLE 10FR (MISCELLANEOUS) ×2
SUCTION TUBE FRAZIER 10FR DISP (MISCELLANEOUS) ×2 IMPLANT
SUT ETHIBOND 0 MO6 C/R (SUTURE) IMPLANT
SUT ETHIBOND 2 OS 4 DA (SUTURE) IMPLANT
SUT ETHILON 3 0 PS 1 (SUTURE) IMPLANT
SUT FIBERWIRE 2-0 18 17.9 3/8 (SUTURE)
SUT MERSILENE 2.0 SH NDLE (SUTURE) IMPLANT
SUT MNCRL AB 4-0 PS2 18 (SUTURE) IMPLANT
SUT MON AB 3-0 SH 27 (SUTURE) ×4
SUT MON AB 3-0 SH27 (SUTURE) ×2 IMPLANT
SUT VIC AB 0 CT1 36 (SUTURE) ×3 IMPLANT
SUT VIC AB 0 SH 27 (SUTURE) IMPLANT
SUT VIC AB 2-0 SH 27 (SUTURE)
SUT VIC AB 2-0 SH 27XBRD (SUTURE) IMPLANT
SUT VIC AB 3-0 SH 27 (SUTURE) ×4
SUT VIC AB 3-0 SH 27X BRD (SUTURE) ×2 IMPLANT
SUTURE FIBERWR 2-0 18 17.9 3/8 (SUTURE) IMPLANT
SYR BULB 3OZ (MISCELLANEOUS) ×4 IMPLANT
SYR CONTROL 10ML LL (SYRINGE) IMPLANT
TOWEL GREEN STERILE FF (TOWEL DISPOSABLE) ×8 IMPLANT
TOWEL OR NON WOVEN STRL DISP B (DISPOSABLE) ×4 IMPLANT
TUBE CONNECTING 20'X1/4 (TUBING) ×1
TUBE CONNECTING 20X1/4 (TUBING) ×3 IMPLANT
UNDERPAD 30X30 (UNDERPADS AND DIAPERS) ×4 IMPLANT
YANKAUER SUCT BULB TIP NO VENT (SUCTIONS) ×4 IMPLANT

## 2018-07-08 NOTE — Discharge Instructions (Signed)

## 2018-07-08 NOTE — Anesthesia Procedure Notes (Signed)
Procedure Name: LMA Insertion Performed by: Verita Lamb, CRNA Pre-anesthesia Checklist: Patient identified, Emergency Drugs available, Suction available, Patient being monitored and Timeout performed Patient Re-evaluated:Patient Re-evaluated prior to induction Oxygen Delivery Method: Circle system utilized Preoxygenation: Pre-oxygenation with 100% oxygen Induction Type: IV induction LMA: LMA inserted LMA Size: 4.0 Tube type: Oral Placement Confirmation: CO2 detector,  breath sounds checked- equal and bilateral and positive ETCO2 Tube secured with: Tape Dental Injury: Teeth and Oropharynx as per pre-operative assessment

## 2018-07-08 NOTE — Anesthesia Procedure Notes (Signed)
Anesthesia Regional Block: Popliteal block   Pre-Anesthetic Checklist: ,, timeout performed, Correct Patient, Correct Site, Correct Laterality, Correct Procedure, Correct Position, site marked, Risks and benefits discussed,  Surgical consent,  Pre-op evaluation,  At surgeon's request and post-op pain management  Laterality: Right  Prep: chloraprep       Needles:  Injection technique: Single-shot  Needle Type: Stimiplex     Needle Length: 9cm  Needle Gauge: 21     Additional Needles:   Procedures:,,,, ultrasound used (permanent image in chart),,,,  Narrative:  Start time: 07/08/2018 8:38 AM End time: 07/08/2018 8:43 AM Injection made incrementally with aspirations every 5 mL.  Performed by: Personally  Anesthesiologist: Lynda Rainwater, MD       0.5% Ropivicaine 71ml added to adductor canal space in addition to popliteal block

## 2018-07-08 NOTE — Op Note (Signed)
Orthopaedic Surgery Operative Note (CSN: [id number])  Keith Price  1981-10-31 Date of Surgery: 07/08/2018   Diagnoses:  Long spiral oblique ankle fracture with syndesmosis widening  Procedure: Open reduction internal fixation of bimalleolar ankle fracture without posterior lip fixation Open reduction internal fixation syndesmosis   Operative Finding Successful completion of planned procedure.  Good purchase in bone with good bone quality.  Anatomic reduction of spiral oblique fibula fracture with 2 lag screws.  Posterior malleolus fragment was well approximated and did not require fixation.  Stress test after tight rope placed was normal.  Post-operative plan: The patient will be nonweightbearing in a splint and transition to a cast for 6 weeks total.  The patient will be discharged home.  DVT prophylaxis aspirin 81 mg a day.  Pain control with PRN pain medication preferring oral medicines.  Follow up plan will be scheduled in approximately 13 days days for incision check and XR.  Post-Op Diagnosis: Same Surgeons:Primary: Hiram Gash, MD Assistants: Joya Gaskins OPAC Location: [facility] OR ROOM 2 Anesthesia: General Antibiotics: Ancef 2g preop, Vancomycin 1000mg  locally  Tourniquet time: 60 Estimated Blood Loss: minimal Complications: None Specimens: None Implants: Implant Name Type Inv. Item Serial No. Manufacturer Lot No. LRB No. Used Action  PLATE 12H - [id number] Plate PLATE 88C  [employer] IN SET Right 1 Implanted  SCREW CORT 2.7X20 - [id number] Screw SCREW CORT 2.7X20  [employer] IN SET Right 2 Implanted  SCREW CANCELLOUS 4.0X16MM - [id number] Screw SCREW CANCELLOUS 4.0X16MM  [employer] IN SET Right 2 Implanted  IMPLANT TIGHTROPE W/DRV K-LESS - [id number] Anchor IMPLANT TIGHTROPE W/DRV [employer] [id number] Right 1 Implanted  SCREW LOW PROFILE 3.5X14 - [id number] Screw SCREW LOW PROFILE 3.5X14  [employer] IN SET Right 3 Implanted  SCREW CANCELLOUS 4X14 - [id number]  Screw SCREW CANCELLOUS 4X14  [employer] IN SET Right 1 Implanted    Indications for Surgery:   Keith Price is a 37 y.o. male with fall resulting in a displaced fibula fracture with syndesmosis widening and medial clear space widening.  Benefits and risks of operative and nonoperative management were discussed prior to surgery with patient/guardian(s) and informed consent form was completed.  Specific risks including infection, need for additional surgery, infection, wound healing issues, continued pain, nonunion   Procedure:   The patient was identified in the preoperative holding area where the surgical site was marked. The patient was taken to the OR where a procedural timeout was called and the above noted anesthesia was induced.  The patient was positioned on a regular bed with bone foam.  Preoperative antibiotics were dosed.  The patient's right ankle was prepped and draped in the usual sterile fashion.  A second preoperative timeout was called.      A tourniquet was used for the above listed time.   We began by using fluoroscopy to localize her incision.  The patient had a long oblique fracture of the fibula exiting about 12 cm proximal to the fibular tip.  Based on this we knew we would have to identify and protect the superficial peroneal nerve.  A long incision was centered over the fibula about 15 cm in length.  Took care to dissect through skin sharply achieving hemostasis we progressed and bluntly dissected the subcutaneous tissue identifying the superficial peroneal nerve exiting the lateral compartment fascia just anterior to the fibula.  Fasciotomy was performed around the nerve and was protected with blunt retractors throughout the case.  Point we used elevators  to elevate periosteum from the bone edges at the fracture.  We noted a long oblique fracture all the way proximal exiting posterior as it progressed proximally.  We able to clear hematoma and interposed periosteum with  a curette and roguer prior to using bone reduction clamps to anatomically reduce the fracture itself.  We checked our position on fluoroscopy.  This point to 2.7 mm Arthrex lag screws were placed by technique anterior to posterior.  Maintained our reduction and had good compression of the fracture site.  We then turned our attention to a neutralization plate and a nonlocking plate was contoured to fit the lateral aspect of the fibula.  We were able to use fluoroscopy to localize the plate position and have 3 unicortical cancellus screws distal to the fracture site and 3 bicortical screws proximal to the fracture site.  This point we turned our attention to the syndesmosis fixation.  Fluoroscopy and external rotation stress test demonstrated syndesmosis widening.  We used fluoroscopy to aid in the placement of a drill bit from anterior to posterior about 25 degrees off axis to re-create the path of the syndesmosis.  This was placed Quadra cortical taking care not to damage skin medially.  We then used the Arthrex tight rope device through the plate and checked its position and button flipping on fluoroscopy prior to deploying the tight rope and tensioning it fully under fluoroscopic guidance.  Fluoroscopic images were obtained demonstrating anatomic reduction of the fracture and fixation of the syndesmosis.  The incision was thoroughly irrigated and closed in a multilayer fashion with absorbable sutures after vancomycin powder was placed locally. A sterile dressing was placed.  A short leg well-padded splint was placed.  The patient was awoken from general anesthesia and taken to the PACU in stable condition without complication.   Joya Gaskins, OPA-C, present and scrubbed throughout the case, critical for completion in a timely fashion, and for retraction, instrumentation, closure.

## 2018-07-08 NOTE — H&P (Signed)
ORTHOPAEDIC history   HPI: Keith Price is a 37 y.o. male with fall from standing about 5 days prior to presentation.  He was seen in the ER and placed in a splint after noting a long oblique fracture of the fibula with syndesmosis widening.  He works at [employer] and is been out of work since.  This was a non-work-related injury per his report.  He said no other major problems since his injuries maintained his nonweightbearing status in the splint.  Is accompanied by his family today.  History is obtained with the help of interpreter.  Denies any other medical problems.  Past Medical History:  Diagnosis Date  . Ankle fracture, right 07/02/2018  . Fever blister   . History of chemotherapy 2011  . History of testicular cancer 2011   Past Surgical History:  Procedure Laterality Date  . CYSTOSCOPY    . CYSTOSCOPY W/ URETERAL STENT PLACEMENT Right 03/16/2010  . ORCHIECTOMY Right 03/16/2010   Social History   Socioeconomic History  . Marital status: Married    Spouse name: Not on file  . Number of children: 3  . Years of education: Not on file  . Highest education level: Not on file  Occupational History  . Not on file  Social Needs  . Financial resource strain: Not on file  . Food insecurity:    Worry: Not on file    Inability: Not on file  . Transportation needs:    Medical: Not on file    Non-medical: Not on file  Tobacco Use  . Smoking status: Never Smoker  . Smokeless tobacco: Never Used  Substance and Sexual Activity  . Alcohol use: No  . Drug use: No  . Sexual activity: Not on file  Lifestyle  . Physical activity:    Days per week: Not on file    Minutes per session: Not on file  . Stress: Not on file  Relationships  . Social connections:    Talks on phone: Not on file    Gets together: Not on file    Attends religious service: Not on file    Active member of club or organization: Not on file    Attends meetings of clubs or organizations: Not  on file    Relationship status: Not on file  Other Topics Concern  . Not on file  Social History Narrative  . Not on file   Family History  Problem Relation Age of Onset  . Diabetes Father   . Diabetes Sister   . Diabetes Brother    No Known Allergies Prior to Admission medications   Medication Sig Start Date End Date Taking? Authorizing Provider  b complex vitamins tablet Take 1 tablet by mouth daily.   Yes [provider]  cholecalciferol (VITAMIN D) 1000 units tablet Take by mouth daily.   Yes [provider]  glucosamine-chondroitin 500-400 MG tablet Take 2 tablets by mouth 2 (two) times daily.   Yes [provider]  oxyCODONE-acetaminophen (PERCOCET/ROXICET) 5-325 MG tablet Take 1 tablet by mouth every 6 (six) hours as needed for severe pain. 07/02/18  Yes Glyn Ade, PA-C  traZODone (DESYREL) 50 MG tablet Take 0.5-1 tablets (25-50 mg total) by mouth at bedtime as needed for sleep. 05/28/18  Yes Azzie Glatter, FNP  valACYclovir (VALTREX) 500 MG tablet Take 1 tablet (500 mg total) by mouth daily. 05/28/18  Yes Azzie Glatter, FNP  vitamin C (ASCORBIC ACID) 500 MG tablet Take  500 mg by mouth daily.   Yes [provider]   No results found. Family History Reviewed and non-contributory, no pertinent history of problems with bleeding or anesthesia      Review of Systems 14 system ROS conducted and negative except for that noted in HPI   OBJECTIVE  Vitals: Patient Vitals for the past 8 hrs:  BP Temp Temp src Pulse Resp SpO2 Height Weight  07/08/18 0801 114/60 98.3 F (36.8 C) Oral 66 20 99 % '5\' 7"'  (1.702 m) 92.7 kg   General: Alert, no acute distress Cardiovascular: No pedal edema Respiratory: No cyanosis, no use of accessory musculature GI: No organomegaly, abdomen is soft and non-tender Skin: No lesions in the area of chief complaint other than those listed below in MSK exam.  Neurologic: Sensation intact distally save for  the below mentioned MSK exam Psychiatric: Patient is competent for consent with normal mood and affect Lymphatic: No axillary or cervical lymphadenopathy Extremities  RLE: splint CDI, +EHL though remainder of motor difficult to test due to splint, sensation intact distally with warm well perfused foot, no pain w passive stretch     Test Results Imaging Trace demonstrate a long oblique fibula fracture extending to about 12 cm proximal to the tip of the fibula.  The syndesmosis somewhat wired with the mortise is wide as well.  Labs cbc No results for input(s): WBC, HGB, HCT, PLT in the last 72 hours.  Labs inflam No results for input(s): CRP in the last 72 hours.  Invalid input(s): ESR  Labs coag No results for input(s): INR, PTT in the last 72 hours.  Invalid input(s): PT  No results for input(s): NA, K, CL, CO2, GLUCOSE, BUN, CREATININE, CALCIUM in the last 72 hours.   ASSESSMENT AND PLAN: 37 y.o. male with the following: Long oblique fibula fracture with mortise widening  At length about the patient's options.  In this young healthy patient with an asymmetric mortise nonoperative measures are unlikely to be successful.  Talked about risk benefits and alternatives of surgery which would include open reduction internal fixation and possible syndesmosis and medial ligament repair.  He understand the risk to the superficial peroneal nerve, specific risk of infection, wound healing issues, nonunion and the family and the patient elected to proceed.  Patient will be nonweightbearing a splint postop and be discharged home.  We will transition him to a cast in clinic in a boot at 6 weeks.  The risks benefits and alternatives were discussed with the patient including but not limited to the risks of nonoperative treatment, versus surgical intervention including infection, bleeding, nerve injury, periprosthetic fracture, the need for revision surgery, leg length discrepancy, gait change, blood  clots, cardiopulmonary complications, morbidity, mortality, among others, and they were willing to proceed.

## 2018-07-08 NOTE — Transfer of Care (Signed)
Immediate Anesthesia Transfer of Care Note  Patient: Keith Price  Procedure(s) Performed: OPEN REDUCTION INTERNAL FIXATION (ORIF) ANKLE FRACTURE (Right Ankle) SYNDESMOSIS REPAIR RIGHT ANKLE (Right Ankle)  Patient Location: PACU  Anesthesia Type:General  Level of Consciousness: awake, alert  and oriented  Airway & Oxygen Therapy: Patient Spontanous Breathing and Patient connected to face mask oxygen  Post-op Assessment: Report given to RN and Post -op Vital signs reviewed and stable  Post vital signs: Reviewed and stable  Last Vitals:  Vitals Value Taken Time  BP 108/67 07/08/2018 11:04 AM  Temp    Pulse 84 07/08/2018 11:06 AM  Resp 15 07/08/2018 11:06 AM  SpO2 100 % 07/08/2018 11:06 AM  Vitals shown include unvalidated device data.  Last Pain:  Vitals:   07/08/18 0801  TempSrc: Oral  PainSc: 8       Patients Stated Pain Goal: 1 (25/42/70 6237)  Complications: No apparent anesthesia complications

## 2018-07-08 NOTE — Progress Notes (Signed)
Assisted Dr. Sabra Heck with right, ultrasound guided, popliteal, adductor canal block. Side rails up, monitors on throughout procedure. See vital signs in flow sheet. Tolerated Procedure well.

## 2018-07-09 NOTE — Anesthesia Postprocedure Evaluation (Signed)
Anesthesia Post Note  Patient: Keith Price  Procedure(s) Performed: OPEN REDUCTION INTERNAL FIXATION (ORIF) ANKLE FRACTURE (Right Ankle) SYNDESMOSIS REPAIR RIGHT ANKLE (Right Ankle)     Patient location during evaluation: PACU Anesthesia Type: General Level of consciousness: awake and alert Pain management: pain level controlled Vital Signs Assessment: post-procedure vital signs reviewed and stable Respiratory status: spontaneous breathing, nonlabored ventilation and respiratory function stable Cardiovascular status: blood pressure returned to baseline and stable Postop Assessment: no apparent nausea or vomiting Anesthetic complications: no    Last Vitals:  Vitals:   07/08/18 1145 07/08/18 1230  BP: 121/71 108/64  Pulse: 91 83  Resp: 15 16  Temp:  36.8 C  SpO2: 97% 98%    Last Pain:  Vitals:   07/08/18 1230  TempSrc:   PainSc: 0-No pain   Pain Goal: Patients Stated Pain Goal: 1 (07/08/18 0801)               Lynda Rainwater

## 2018-07-12 ENCOUNTER — Encounter (HOSPITAL_BASED_OUTPATIENT_CLINIC_OR_DEPARTMENT_OTHER): Payer: Self-pay | Admitting: Orthopaedic Surgery

## 2018-09-24 ENCOUNTER — Inpatient Hospital Stay: Payer: Self-pay | Attending: Nurse Practitioner

## 2018-09-24 DIAGNOSIS — C6211 Malignant neoplasm of descended right testis: Secondary | ICD-10-CM | POA: Insufficient documentation

## 2018-09-24 DIAGNOSIS — D509 Iron deficiency anemia, unspecified: Secondary | ICD-10-CM | POA: Insufficient documentation

## 2018-09-24 LAB — CBC WITH DIFFERENTIAL (CANCER CENTER ONLY)
Abs Immature Granulocytes: 0.01 10*3/uL (ref 0.00–0.07)
BASOS ABS: 0.1 10*3/uL (ref 0.0–0.1)
Basophils Relative: 1 %
EOS PCT: 0 %
Eosinophils Absolute: 0 10*3/uL (ref 0.0–0.5)
HCT: 48.2 % (ref 39.0–52.0)
Hemoglobin: 16.2 g/dL (ref 13.0–17.0)
Immature Granulocytes: 0 %
Lymphocytes Relative: 20 %
Lymphs Abs: 1.3 10*3/uL (ref 0.7–4.0)
MCH: 29.3 pg (ref 26.0–34.0)
MCHC: 33.6 g/dL (ref 30.0–36.0)
MCV: 87.3 fL (ref 80.0–100.0)
Monocytes Absolute: 0.5 10*3/uL (ref 0.1–1.0)
Monocytes Relative: 7 %
NRBC: 0 % (ref 0.0–0.2)
Neutro Abs: 4.6 10*3/uL (ref 1.7–7.7)
Neutrophils Relative %: 72 %
Platelet Count: 212 10*3/uL (ref 150–400)
RBC: 5.52 MIL/uL (ref 4.22–5.81)
RDW: 13.2 % (ref 11.5–15.5)
WBC: 6.4 10*3/uL (ref 4.0–10.5)

## 2018-09-24 LAB — FERRITIN: Ferritin: 50 ng/mL (ref 24–336)

## 2018-09-25 ENCOUNTER — Telehealth: Payer: Self-pay | Admitting: *Deleted

## 2018-09-25 NOTE — Telephone Encounter (Signed)
Notified of normal Hgb/ferritin. Follow up as scheduled.

## 2018-09-25 NOTE — Telephone Encounter (Signed)
-----   Message from Ladell Pier, MD sent at 09/24/2018  1:47 PM EST ----- Please call patient, hemoglobin and iron levels are normal, follow-up as scheduled, repeat CBC and ferritin next visit

## 2018-11-29 ENCOUNTER — Encounter: Payer: Self-pay | Admitting: Family Medicine

## 2018-11-29 ENCOUNTER — Ambulatory Visit (INDEPENDENT_AMBULATORY_CARE_PROVIDER_SITE_OTHER): Payer: Self-pay | Admitting: Family Medicine

## 2018-11-29 VITALS — BP 114/64 | HR 92 | Temp 98.2°F | Ht 67.0 in | Wt 205.0 lb

## 2018-11-29 DIAGNOSIS — Z131 Encounter for screening for diabetes mellitus: Secondary | ICD-10-CM

## 2018-11-29 DIAGNOSIS — Z09 Encounter for follow-up examination after completed treatment for conditions other than malignant neoplasm: Secondary | ICD-10-CM

## 2018-11-29 DIAGNOSIS — R6889 Other general symptoms and signs: Secondary | ICD-10-CM | POA: Insufficient documentation

## 2018-11-29 DIAGNOSIS — G8929 Other chronic pain: Secondary | ICD-10-CM | POA: Insufficient documentation

## 2018-11-29 DIAGNOSIS — M25571 Pain in right ankle and joints of right foot: Secondary | ICD-10-CM

## 2018-11-29 LAB — POCT URINALYSIS DIP (MANUAL ENTRY)
Blood, UA: NEGATIVE
Glucose, UA: NEGATIVE mg/dL
Leukocytes, UA: NEGATIVE
Nitrite, UA: NEGATIVE
Protein Ur, POC: 30 mg/dL — AB
Spec Grav, UA: >=1.03 — AB (ref 1.010–1.025)
Urobilinogen, UA: 1 E.U./dL
pH, UA: 5.5 (ref 5.0–8.0)

## 2018-11-29 LAB — POCT GLYCOSYLATED HEMOGLOBIN (HGB A1C): Hemoglobin A1C: 5.2 % (ref 4.0–5.6)

## 2018-11-29 LAB — POCT INFLUENZA A/B
Influenza A, POC: NEGATIVE
Influenza B, POC: NEGATIVE

## 2018-11-29 MED ORDER — MELOXICAM 7.5 MG PO TABS: 7.5000 mg | ORAL_TABLET | Freq: Every day | ORAL | 2 refills | Status: AC

## 2018-11-29 NOTE — Progress Notes (Signed)
Follow Up  Subjective:    Patient ID: Shravan Salahuddin, male    DOB: [birth year], 38 y.o.   MRN: [id number]  Chief Complaint  Patient presents with  . Fever    3 days  . Cough  . Follow-up   HPI  [name] is a 38 year old male with a past medical history of Testicular Cancer, Fever Blister, and Right Ankle Fracture. He is here today for follow up.   Current Status: Since his last office visit, he is doing well with c/o fevers, chills, night sweats, and increasing fatigue for about 1 week. He reports mild throat pain. He denies recent infections, and weight loss. He has not had any headaches, visual changes, dizziness, and falls. No chest pain, heart palpitations, cough and shortness of breath reported. No reports of GI problems such as nausea, vomiting, diarrhea, and constipation. He has no reports of blood in stools, dysuria and hematuria. No depression or anxiety reported. He denies pain today.   Review of Systems  Constitutional: Positive for chills (Intermittent), diaphoresis (Intermittent), fatigue (Intermittent ) and fever (Intermittent).  HENT: Positive for sore throat.   Eyes: Negative.   Respiratory: Negative.   Cardiovascular: Negative.   Gastrointestinal: Negative.   Endocrine: Negative.   Musculoskeletal: Negative.   Skin: Negative.   Allergic/Immunologic: Negative.   Neurological: Negative.   Hematological: Negative.   Psychiatric/Behavioral: Negative.    Objective:   Physical Exam Vitals signs and nursing note reviewed.  Constitutional:      Appearance: Normal appearance.  HENT:     Head: Normocephalic and atraumatic.     Right Ear: External ear normal.     Left Ear: External ear normal.     Nose: Nose normal.  Eyes:     Conjunctiva/sclera: Conjunctivae normal.  Neck:     Musculoskeletal: Normal range of motion and neck supple.  Cardiovascular:     Rate and Rhythm: Normal rate and regular rhythm.     Pulses: Normal pulses.     Heart sounds:  Normal heart sounds.  Pulmonary:     Effort: Pulmonary effort is normal.     Breath sounds: Normal breath sounds.  Abdominal:     General: Bowel sounds are normal.  Musculoskeletal: Normal range of motion.  Skin:    General: Skin is warm and dry.     Capillary Refill: Capillary refill takes less than 2 seconds.  Neurological:     General: No focal deficit present.     Mental Status: He is alert and oriented to person, place, and time.  Psychiatric:        Mood and Affect: Mood normal.        Behavior: Behavior normal.        Thought Content: Thought content normal.        Judgment: Judgment normal.    Assessment & Plan:   1. Chronic pain of right ankle Stable. Not worsening.  - meloxicam (MOBIC) 7.5 MG tablet; Take 1 tablet (7.5 mg total) by mouth daily.  Dispense: 30 tablet; Refill: 2  2. Flu-like symptoms He is negative for influenza. He will treat symptoms as directed. He will increase fluids and get plenty of rest, use Acetaminophen for fevers, and Motrin for body aches. Avoid persons who are infected, eat well and drink plenty of fluids. He will contact our office if symptoms do not improve or worsen.  - Influenza A/B  3. Screening for diabetes mellitus Hg is within normal range of 5.2 today. She  will continue to decrease foods/beverages high in sugars and carbs and follow Heart Healthy or DASH diet. Increase physical activity to at least 30 minutes cardio exercise daily.  - POCT glycosylated hemoglobin (Hb A1C)  4. Follow up He will follow up in 6 months.  - POCT urinalysis dipstick  Meds ordered this encounter  Medications  . meloxicam (MOBIC) 7.5 MG tablet    Sig: Take 1 tablet (7.5 mg total) by mouth daily.    Dispense:  30 tablet    Refill:  Amber,  MSN, FNP-C [facility] [address] [place], [place] [address] [phone]

## 2018-11-29 NOTE — Patient Instructions (Signed)
Ibuprofen; Pseudoephedrine tablets or caplets What is this medicine? IBUPROFEN; PSEUDOEPHEDRINE (eye BYOO proe fen; soo doe e FED rin) is a combination of a non-steroidal anti-inflammatory drug (NSAID) and a decongestant. It is used to treat the aches and pains, congestion, and fever of the common cold, flu, or sinus problems. This medicine may be used for other purposes; ask your health care provider or pharmacist if you have questions. COMMON BRAND NAME(S): Advil Cold and Sinus, Advil Flu and Body Ache, Dristan Sinus, Motrin Cold and Sinus, Motrin Sinus Headache What should I tell my health care provider before I take this medicine? They need to know if you have any of the following conditions: -bleeding problems -diabetes -heart disease or surgery -high blood pressure -kidney disease -not drinking fluids -severe vomiting or diarrhea -stomach ulcer or other problems -taken an MAOI like Carbex, Eldepryl, Marplan, Nardil, or Parnate in last 14 days -thyroid disease -an unusual or allergic reaction to ibuprofen, pseudoephedrine, other fever reducers or pain relievers, other medicines, foods, dyes, or preservatives -pregnant or trying to get pregnant -breast-feeding How should I use this medicine? Take this medicine by mouth with a glass of water. Follow the directions on the package label. You can take it with or without food. If it upsets your stomach, take it with food. Try to not lie down for at least 10 minutes after you take the medicine. Take your medicine at regular intervals. Do not take it more often than directed. Talk to your pediatrician regarding the use of this medicine in children. While this drug may be prescribed for children as young as 28 years old for selected conditions, precautions do apply. Patients over 60 years old may have a stronger reaction and need a smaller dose. Overdosage: If you think you have taken too much of this medicine contact a poison control center or  emergency room at once. NOTE: This medicine is only for you. Do not share this medicine with others. What if I miss a dose? If you miss a dose, take it as soon as you can. If it is almost time for your next dose, take only that dose. Do not take double or extra doses. What may interact with this medicine? Do not take this medicine with any of the following medications: -bromocriptine -cidofovir -cocaine -ergot alkaloids like dihydroergotamine, ergonovine, ergotamine, methylergonovine -MAOIs like Carbex, Eldepryl, Marplan, Nardil, and Parnate -methotrexate -pemetrexed -stimulant medicines for attention disorders, weight loss, or to stay awake This medicine may also interact with the following medications: -alcohol -alendronate -aspirin and aspirin-like medicines -cyclopropane -furazolidone -linezolid -ginkgo -mecamylamine -medicines for bladder problems -medicines for blood pressure, heart disease, irregular heart beat -medicines for chest pain like digoxin, nifedipine, verapamil -medicines for depression, anxiety, or psychotic disturbances -medicine for prostate -medicines for sleep during surgery -medicines that treat or prevent blood clots like warfarin, enoxaparin, and dalteparin -other drugs for congestion, fever, inflammation, or pain -procarbazine -reserpine -steroid medicines like prednisone or cortisone -St. John's Wort This list may not describe all possible interactions. Give your health care provider a list of all the medicines, herbs, non-prescription drugs, or dietary supplements you use. Also tell them if you smoke, drink alcohol, or use illegal drugs. Some items may interact with your medicine. What should I watch for while using this medicine? Tell your doctor or healthcare professional if your symptoms do not start to get better or if they get worse. See your doctor if fever, pain, or nasal congestion gets worse or lasts more than  3 days. If this medicine makes  it hard for you to sleep, try taking the dose earlier in the day. If you still have trouble sleeping stop taking this medicine and see your doctor. What side effects may I notice from receiving this medicine? Side effects that you should report to your doctor or health care professional as soon as possible: -allergic reactions like skin rash, itching or hives, swelling of the face, lips, or tongue -fast, irregular heartbeat -feeling faint or lightheaded, falls -hallucinations -high blood pressure -pain, tingling, numbness in the hands or feet -severe stomach pain -signs and symptoms of bleeding such as bloody or black, tarry stools; red or dark-brown urine; spitting up blood or brown material that looks like coffee grounds; red spots on the skin; unusual bruising or bleeding from the eye, gums, or nose -signs and symptoms of a blood clot such as changes in vision; chest pain; severe, sudden headache; trouble speaking; sudden numbness or weakness of the face, arm, or leg -trouble passing urine or change in the amount of urine -unexplained weight gain or swelling -unusually weak or tired -yellowing of the eyes or skin Side effects that usually do not require medical attention (report to your doctor or health care professional if they continue or are bothersome): -anxious -bruising -constipation or diarrhea -headache -loss of appetite -nausea, vomiting -trouble sleeping This list may not describe all possible side effects. Call your doctor for medical advice about side effects. You may report side effects to FDA at 1-800-FDA-1088. Where should I keep my medicine? Keep out of the reach of children. This medicine may cause accidental overdose and death if taken by other adults, children, or pets. Mix any unused medicine with a substance like cat littler or coffee grounds. Then throw the medicine away in a sealed container like a sealed bag or a coffee can with a lid. Do not use the medicine  after the expiration date. Store at room temperature between 20 and 25 degrees C (68 and 77 degrees F). NOTE: This sheet is a summary. It may not cover all possible information. If you have questions about this medicine, talk to your doctor, pharmacist, or health care provider.  2019 Elsevier/Gold Standard (2014-07-04 19:24:56) Fever, Adult     A fever is an increase in your body's temperature. It often means a temperature of 100.64F (38C) or higher. Brief mild or moderate fevers often have no long-term effects. They often do not need treatment. Moderate or high fevers may make you feel uncomfortable. Sometimes, they can be a sign of a serious illness or disease. A fever that keeps coming back or that lasts a long time may cause you to lose water in your body (get dehydrated). You can take your temperature with a thermometer to see if you have a fever. Temperature can change with:  Age.  Time of day.  Where the thermometer is put in the body. Readings may vary when the thermometer is put: ? In the mouth (oral). ? In the butt (rectal). ? In the ear (tympanic). ? Under the arm (axillary). ? On the forehead (temporal). Follow these instructions at home: Medicines  Take over-the-counter and prescription medicines only as told by your doctor. Follow the dosing instructions carefully.  If you were prescribed an antibiotic medicine, take it as told by your doctor. Do not stop taking it even if you start to feel better. General instructions  Watch for any changes in your symptoms. Tell your doctor about them.  Rest  as needed.  Drink enough fluid to keep your pee (urine) pale yellow.  Sponge yourself or bathe with room-temperature water as needed. This helps to lower your body temperature. Do not use ice water.  Do not use too many blankets or wear clothes that are too heavy.  If your fever was caused by an infection that spreads from person to person (is contagious), such as a cold  or the flu: ? You should stay home from work and public places for at least 24 hours after your fever is gone. ? Your fever should be gone for at least 24 hours without the need to use medicines. Contact a doctor if:  You throw up (vomit).  You cannot eat or drink without throwing up.  You have watery poop (diarrhea).  It hurts when you pee.  Your symptoms do not get better with treatment.  You have new symptoms.  You feel very weak. Get help right away if:  You are short of breath or have trouble breathing.  You are dizzy or you pass out (faint).  You feel mixed up (confused).  You have signs of not having enough water in your body, such as: ? Dark pee, very little pee, or no pee. ? Cracked lips. ? Dry mouth. ? Sunken eyes. ? Sleepiness. ? Weakness.  You have very bad pain in your belly (abdomen).  You keep throwing up or having watery poop.  You have a rash on your skin.  Your symptoms get worse all of a sudden. Summary  A fever is an increase in your body's temperature. It often means a temperature of 100.38F (38C) or higher.  Watch for any changes in your symptoms. Tell your doctor about them.  Take all medicines only as told by your doctor.  Do not go to work or other public places if your fever was caused by an illness that can spread to other people.  Get help right away if you have signs that you do not have enough water in your body. This information is not intended to replace advice given to you by your health care provider. Make sure you discuss any questions you have with your health care provider. Document Released: 08/08/2008 Document Revised: 04/15/2018 Document Reviewed: 04/15/2018 Elsevier Interactive Patient Education  2019 [employer].

## 2019-03-06 ENCOUNTER — Telehealth: Payer: Self-pay | Admitting: Nurse Practitioner

## 2019-03-06 ENCOUNTER — Other Ambulatory Visit: Payer: Self-pay

## 2019-03-06 ENCOUNTER — Telehealth: Payer: Self-pay

## 2019-03-06 ENCOUNTER — Ambulatory Visit: Payer: Self-pay | Admitting: Nurse Practitioner

## 2019-03-06 NOTE — Telephone Encounter (Signed)
TC to patient per Lattie Haw to let him know that his appointment today with Lattie Haw at 3:15p is cancelled. Spoke with spouse Peter Congo [phone]) and let her know that his appointment will be moved out 2 months and that schedulers will call with the appointment date and time. Verbalized understanding. No further problems or concerns at this time.

## 2019-03-06 NOTE — Telephone Encounter (Signed)
R/s appt per 4/23 sch message - sent reminder letter in the mail with appt date and time

## 2019-05-06 ENCOUNTER — Inpatient Hospital Stay: Payer: Self-pay | Attending: Oncology

## 2019-05-06 ENCOUNTER — Inpatient Hospital Stay (HOSPITAL_BASED_OUTPATIENT_CLINIC_OR_DEPARTMENT_OTHER): Payer: Self-pay | Admitting: Nurse Practitioner

## 2019-05-06 ENCOUNTER — Other Ambulatory Visit: Payer: Self-pay

## 2019-05-06 ENCOUNTER — Encounter: Payer: Self-pay | Admitting: Nurse Practitioner

## 2019-05-06 VITALS — BP 115/70 | HR 62 | Temp 98.3°F | Resp 18 | Ht 67.0 in | Wt 198.8 lb

## 2019-05-06 DIAGNOSIS — C6211 Malignant neoplasm of descended right testis: Secondary | ICD-10-CM

## 2019-05-06 DIAGNOSIS — D509 Iron deficiency anemia, unspecified: Secondary | ICD-10-CM

## 2019-05-06 LAB — CBC WITH DIFFERENTIAL (CANCER CENTER ONLY)
Abs Immature Granulocytes: 0.01 10*3/uL (ref 0.00–0.07)
Basophils Absolute: 0 10*3/uL (ref 0.0–0.1)
Basophils Relative: 1 %
Eosinophils Absolute: 0 10*3/uL (ref 0.0–0.5)
Eosinophils Relative: 0 %
HCT: 47.2 % (ref 39.0–52.0)
Hemoglobin: 16.3 g/dL (ref 13.0–17.0)
Immature Granulocytes: 0 %
Lymphocytes Relative: 22 %
Lymphs Abs: 1.5 10*3/uL (ref 0.7–4.0)
MCH: 29.3 pg (ref 26.0–34.0)
MCHC: 34.5 g/dL (ref 30.0–36.0)
MCV: 84.7 fL (ref 80.0–100.0)
Monocytes Absolute: 0.5 10*3/uL (ref 0.1–1.0)
Monocytes Relative: 8 %
Neutro Abs: 4.8 10*3/uL (ref 1.7–7.7)
Neutrophils Relative %: 69 %
Platelet Count: 185 10*3/uL (ref 150–400)
RBC: 5.57 MIL/uL (ref 4.22–5.81)
RDW: 13.2 % (ref 11.5–15.5)
WBC Count: 6.9 10*3/uL (ref 4.0–10.5)
nRBC: 0 % (ref 0.0–0.2)

## 2019-05-06 NOTE — Progress Notes (Addendum)
[facility] OFFICE PROGRESS NOTE   Diagnosis: Testicular cancer  INTERVAL HISTORY:   [name] returns for follow-up.  He feels well.  He has a good appetite.  No nausea or vomiting.  No bleeding.  No abdominal pain.  Bowels moving regularly.  Objective:  Vital signs in last 24 hours:  Blood pressure 115/70, pulse 62, temperature 98.3 F (36.8 C), temperature source Oral, resp. rate 18, height 5\' 7"  (1.702 m), weight 198 lb 12.8 oz (90.2 kg), SpO2 98 %.    HEENT: Neck without mass. Lymphatics: No palpable cervical, supraclavicular, axillary or inguinal lymph nodes. GI: Abdomen soft and nontender.  No hepatomegaly.  No mass. Vascular: No leg edema. GU: Uncircumcised male.  Status post right orchiectomy.  Left testicle without mass.  Lab Results:  Lab Results  Component Value Date   WBC 6.9 05/06/2019   HGB 16.3 05/06/2019   HCT 47.2 05/06/2019   MCV 84.7 05/06/2019   PLT 185 05/06/2019   NEUTROABS 4.8 05/06/2019    Imaging:  No results found.  Medications: I have reviewed the patient's current medications.  Assessment/Plan: 1. 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 Staging CT scan showed a large right retroperitoneal mass, scattered tiny bilateral pulmonary parenchymal nodules and a 1-cm subtle lesion in the posterior right liver. Brain CT was negative for evidence of metastatic disease. He completed 4 cycles of BEP chemotherapy with the last cycle given June 14, 2010. The alpha-fetoprotein and beta HCG tumor markers were normal on July 10, 2010. Restaging CTs of the chest, abdomen and pelvis on September 09, 2010, revealed resolution of nodular densities in the right lung, a decrease in the size of liver lesions and a further decrease in the size of the right retroperitoneal mass. New  inflammatory changes were noted in the right lung. He underwent a right hepatectomy and retroperitoneal lymph node dissection at [facility] on November 15, 2010. The pathology confirmed no evidence of malignancy in the liver or retroperitoneal lymph nodes. The precaval mass contained mature teratoma and no immature elements or malignancy were identified. Alpha-fetoprotein and beta HCG tumor markers were in normal range on May 11, 2011. Chest x-ray on May 10, 2011, was negative as well. Alpha-fetoprotein and beta hCG tumor markers were in normal range 08/08/2011 and 11/09/2011. Restaging CT scans of the chest, abdomen and pelvis 08/21/2011 showed no evidence of metastatic disease. There was stable to minimal improvement in left-sided lung nodules. There was no evidence of residual hepatic metastasis. New low-density left retroperitoneal "lesions" were favored to represent postoperative lymphoceles or seromas. Necrotic nodal metastasis was felt to be less likely. A previously noted necrotic "lesion" at the precaval space of the right retroperitoneum had resolved or been resected. AFP and beta hCG tumor markers in normal range on 01/01/2015 2. Obstruction of the proximal right ureter and moderate right hydronephrosis secondary to the large right retroperitoneal mass status post placement of double-J stent Mar 16, 2010. 3. Abdominal pain secondary to the right retroperitoneal mass, resolved. 4. History of rate related reaction to etoposide. 5. Elevated LDH, alpha-fetoprotein and beta HCG at the time of diagnosis May 2011.  6. History of intermittent dysuria, resolved. 7. History of "hemorrhoids." Hemorrhoids were noted on the colonoscopy 07/19/2011. 8. History of chills and sweats following bleomycin. 9. History of thrombocytopenia secondary to chemotherapy. 10. 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 11. Mild microcytic anemia with labs  confirming iron deficiency with a ferritin of 5 on 05/11/2011. Stool Hemoccult was negative x3 on 06/06/2011. Urinalysis was negative for blood on 05/23/2011. Colonoscopy by Dr. Ardis Hughs on 07/19/2011 showed findings of small external hemorrhoids with an otherwise normal examination. Stool Hemoccult negative on 08/17/2011. The hemoglobin corrected into normal range.  Red cell microcytosis with iron deficiency 03/12/2017   Disposition: [name] remains in clinical remission from testicular cancer.  We will follow-up on the tumor markers from today.  We reviewed the CBC from today.  The hemoglobin and MCV remain in normal range.  We will follow-up on the ferritin from today.  He will return for lab and follow-up in 1 year.  He will contact the office in the interim with any problems.  Patient seen with Dr. Benay Spice.   Ned Card ANP/GNP-BC   05/06/2019  4:06 PM This was a shared visit with Ned Card.  [name] is in remission from testicular cancer.  We will follow-up on the tumor markers from today.  He would like to continue follow-up at the Cancer center.  Julieanne Manson, MD

## 2019-05-07 ENCOUNTER — Telehealth: Payer: Self-pay | Admitting: Nurse Practitioner

## 2019-05-07 LAB — AFP TUMOR MARKER: AFP, Serum, Tumor Marker: 2.9 ng/mL (ref 0.0–8.3)

## 2019-05-07 LAB — BETA HCG QUANT (REF LAB): hCG Quant: <1 m[IU]/mL (ref 0–3)

## 2019-05-07 LAB — FERRITIN: Ferritin: 16 ng/mL — ABNORMAL LOW (ref 24–336)

## 2019-05-07 NOTE — Telephone Encounter (Signed)
Scheduled per los. Mailed printout  °

## 2019-05-30 ENCOUNTER — Ambulatory Visit: Payer: Self-pay | Admitting: Family Medicine

## 2019-06-17 ENCOUNTER — Ambulatory Visit (INDEPENDENT_AMBULATORY_CARE_PROVIDER_SITE_OTHER): Payer: Self-pay | Admitting: Family Medicine

## 2019-06-17 ENCOUNTER — Other Ambulatory Visit: Payer: Self-pay

## 2019-06-17 ENCOUNTER — Encounter: Payer: Self-pay | Admitting: Family Medicine

## 2019-06-17 VITALS — BP 120/60 | HR 66 | Temp 97.8°F | Ht 67.0 in | Wt 197.0 lb

## 2019-06-17 DIAGNOSIS — G4700 Insomnia, unspecified: Secondary | ICD-10-CM

## 2019-06-17 DIAGNOSIS — B999 Unspecified infectious disease: Secondary | ICD-10-CM

## 2019-06-17 DIAGNOSIS — Z131 Encounter for screening for diabetes mellitus: Secondary | ICD-10-CM

## 2019-06-17 DIAGNOSIS — Z09 Encounter for follow-up examination after completed treatment for conditions other than malignant neoplasm: Secondary | ICD-10-CM

## 2019-06-17 DIAGNOSIS — Z0000 Encounter for general adult medical examination without abnormal findings: Secondary | ICD-10-CM

## 2019-06-17 LAB — POCT GLYCOSYLATED HEMOGLOBIN (HGB A1C): Hemoglobin A1C: 5.5 % (ref 4.0–5.6)

## 2019-06-17 LAB — POCT URINALYSIS DIP (MANUAL ENTRY)
Bilirubin, UA: NEGATIVE
Blood, UA: NEGATIVE
Glucose, UA: NEGATIVE mg/dL
Ketones, POC UA: NEGATIVE mg/dL
Leukocytes, UA: NEGATIVE
Nitrite, UA: NEGATIVE
Protein Ur, POC: NEGATIVE mg/dL
Spec Grav, UA: 1.025 (ref 1.010–1.025)
Urobilinogen, UA: 1 E.U./dL
pH, UA: 7 (ref 5.0–8.0)

## 2019-06-17 NOTE — Progress Notes (Signed)
[facility]   Established Patient Office Visit  Subjective:  Patient ID: Keith Price, male    DOB: [birth year]  Age: 38 y.o. MRN: [id number]  CC:  Chief Complaint  Patient presents with  . Dizziness  . Nausea  . Rash    penis area    HPI [name] is a 38 year old male who presents for follow up today.   Past Medical History:  Diagnosis Date  . Ankle fracture, right 07/02/2018  . Fever blister   . History of chemotherapy 2011  . History of testicular cancer 2011   Current Status: Since his last office visit, he has c/o dizziness and nausea last week X 2-3 days. He did not take any medication for relief. He continues to have intermittent episodes of dizziness. He states that he is not taking any other supplements. He denies any head injuries. He has not had any headaches, visual changes, and falls.  He denies fevers, chills, fatigue, recent infections, weight loss, and night sweats.  No chest pain, heart palpitations, cough and shortness of breath reported. No reports of GI problems such as nausea, vomiting, diarrhea, and constipation. He has no reports of blood in stools, dysuria and hematuria. No depression or anxiety reported. He denies pain today.   Past Surgical History:  Procedure Laterality Date  . CYSTOSCOPY    . CYSTOSCOPY W/ URETERAL STENT PLACEMENT Right 03/16/2010  . ORCHIECTOMY Right 03/16/2010  . ORIF ANKLE FRACTURE Right 07/08/2018   Procedure: OPEN REDUCTION INTERNAL FIXATION (ORIF) ANKLE FRACTURE;  Surgeon: Hiram Gash, MD;  Location: [facility];  Service: Orthopedics;  Laterality: Right;  . SYNDESMOSIS REPAIR Right 07/08/2018   Procedure: SYNDESMOSIS REPAIR RIGHT ANKLE;  Surgeon: Hiram Gash, MD;  Location: [facility];  Service: Orthopedics;  Laterality: Right;    Family History  Problem Relation Age of Onset  . Diabetes Father   . Diabetes Sister   .  Diabetes Brother     Social History   Socioeconomic History  . Marital status: Married    Spouse name: Not on file  . Number of children: 3  . Years of education: Not on file  . Highest education level: Not on file  Occupational History  . Not on file  Social Needs  . Financial resource strain: Not on file  . Food insecurity    Worry: Not on file    Inability: Not on file  . Transportation needs    Medical: Not on file    Non-medical: Not on file  Tobacco Use  . Smoking status: Never Smoker  . Smokeless tobacco: Never Used  Substance and Sexual Activity  . Alcohol use: No  . Drug use: No  . Sexual activity: Not on file  Lifestyle  . Physical activity    Days per week: Not on file    Minutes per session: Not on file  . Stress: Not on file  Relationships  . Social Herbalist on phone: Not on file    Gets together: Not on file    Attends religious service: Not on file    Active member of club or organization: Not on file    Attends meetings of clubs or organizations: Not on file    Relationship status: Not on file  . Intimate partner violence    Fear of current or ex partner: Not on file    Emotionally abused: Not  on file    Physically abused: Not on file    Forced sexual activity: Not on file  Other Topics Concern  . Not on file  Social History Narrative  . Not on file    Outpatient Medications Prior to Visit  Medication Sig Dispense Refill  . b complex vitamins tablet Take 1 tablet by mouth daily.    . cholecalciferol (VITAMIN D) 1000 units tablet Take by mouth daily.    Marland Kitchen glucosamine-chondroitin 500-400 MG tablet Take 2 tablets by mouth 2 (two) times daily.    . traZODone (DESYREL) 50 MG tablet Take 0.5-1 tablets (25-50 mg total) by mouth at bedtime as needed for sleep. 30 tablet 2  . valACYclovir (VALTREX) 500 MG tablet Take 1 tablet (500 mg total) by mouth daily. 30 tablet 2  . vitamin C (ASCORBIC ACID) 500 MG tablet Take 500 mg by mouth daily.     Marland Kitchen omeprazole (PRILOSEC) 20 MG capsule Take 1 capsule (20 mg total) by mouth daily for 14 days. 14 capsule 0   No facility-administered medications prior to visit.     No Known Allergies  ROS Review of Systems  Constitutional: Negative.   HENT: Negative.   Eyes: Negative.   Respiratory: Negative.   Cardiovascular: Negative.   Gastrointestinal: Negative.   Endocrine: Negative.   Genitourinary: Negative.   Musculoskeletal: Negative.   Skin: Negative.   Allergic/Immunologic: Negative.   Neurological: Negative.   Hematological: Negative.   Psychiatric/Behavioral: Negative.       Objective:    Physical Exam  Constitutional: He is oriented to person, place, and time. He appears well-developed and well-nourished.  HENT:  Head: Normocephalic and atraumatic.  Eyes: Conjunctivae are normal.  Neck: Normal range of motion. Neck supple.  Cardiovascular: Normal rate, regular rhythm, normal heart sounds and intact distal pulses.  Pulmonary/Chest: Effort normal and breath sounds normal.  Abdominal: Soft. Bowel sounds are normal.  Musculoskeletal: Normal range of motion.  Neurological: He is alert and oriented to person, place, and time. He has normal reflexes.  Skin: Skin is warm and dry.  Psychiatric: He has a normal mood and affect. His behavior is normal. Judgment and thought content normal.  Nursing note and vitals reviewed.   BP 120/60 (BP Location: Right Arm, Patient Position: Sitting, Cuff Size: Small)   Pulse 66   Temp 97.8 F (36.6 C) (Oral)   Ht 5\' 7"  (1.702 m)   Wt 197 lb (89.4 kg)   SpO2 98%   BMI 30.85 kg/m  Wt Readings from Last 3 Encounters:  06/17/19 197 lb (89.4 kg)  05/06/19 198 lb 12.8 oz (90.2 kg)  11/29/18 205 lb (93 kg)     Health Maintenance Due  Topic Date Due  . INFLUENZA VACCINE  06/14/2019    There are no preventive care reminders to display for this patient.  Lab Results  Component Value Date   TSH 2.540 06/17/2019   Lab Results   Component Value Date   WBC 6.6 06/17/2019   HGB 16.3 06/17/2019   HCT 48.4 06/17/2019   MCV 87 06/17/2019   PLT 183 06/17/2019   Lab Results  Component Value Date   NA 139 06/17/2019   K 4.3 06/17/2019   CO2 25 06/17/2019   GLUCOSE 77 06/17/2019   BUN 16 06/17/2019   CREATININE 0.94 06/17/2019   BILITOT 0.5 06/17/2019   ALKPHOS 92 06/17/2019   AST 32 06/17/2019   ALT 34 06/17/2019   PROT 7.0 06/17/2019   ALBUMIN  4.6 06/17/2019   CALCIUM 9.1 06/17/2019   Lab Results  Component Value Date   CHOL 136 06/17/2019   Lab Results  Component Value Date   HDL 41 06/17/2019   Lab Results  Component Value Date   LDLCALC 68 06/17/2019   Lab Results  Component Value Date   TRIG 137 06/17/2019   Lab Results  Component Value Date   CHOLHDL 3.3 06/17/2019   Lab Results  Component Value Date   HGBA1C 5.5 06/17/2019      Assessment & Plan:   1. Genital infection - Chlamydia/GC NAA, Confirmation  2. Insomnia, unspecified type  3. Healthcare maintenance - HepB+HepC+HIV Panel - CBC with Differential - Comprehensive metabolic panel - TSH - Lipid Panel - Vitamin B12 - Vitamin D, 25-hydroxy  4. Screening for diabetes mellitus Hgb A1c is stable at 5.5 today. He will continue to decrease foods/beverages high in sugars and carbs and follow Heart Healthy or DASH diet. Increase physical activity to at least 30 minutes cardio exercise daily.  - POCT glycosylated hemoglobin (Hb A1C)  5. Follow up He will follow up in 6 months.  - POCT urinalysis dipstick  No orders of the defined types were placed in this encounter.   Orders Placed This Encounter  Procedures  . Chlamydia/GC NAA, Confirmation  . HepB+HepC+HIV Panel  . CBC with Differential  . Comprehensive metabolic panel  . TSH  . Lipid Panel  . Vitamin B12  . Vitamin D, 25-hydroxy  . POCT urinalysis dipstick  . POCT glycosylated hemoglobin (Hb A1C)    Referral Orders  No referral(s) requested today     Kathe Becton,  MSN, FNP-BC [facility] [address] [place], [place] [address] [phone]- fax   Problem List Items Addressed This Visit    None    Visit Diagnoses    Genital infection    -  Primary   Relevant Orders   Chlamydia/GC NAA, Confirmation   Insomnia, unspecified type       Healthcare maintenance       Relevant Orders   HepB+HepC+HIV Panel (Completed)   CBC with Differential (Completed)   Comprehensive metabolic panel (Completed)   TSH (Completed)   Lipid Panel (Completed)   Vitamin B12 (Completed)   Vitamin D, 25-hydroxy (Completed)   Screening for diabetes mellitus       Relevant Orders   POCT glycosylated hemoglobin (Hb A1C) (Completed)   Follow up       Relevant Orders   POCT urinalysis dipstick (Completed)      No orders of the defined types were placed in this encounter.   Follow-up: Return in about 6 months (around 12/18/2019).    Azzie Glatter, FNP

## 2019-06-17 NOTE — Progress Notes (Signed)
3

## 2019-06-18 DIAGNOSIS — B999 Unspecified infectious disease: Secondary | ICD-10-CM | POA: Insufficient documentation

## 2019-06-18 DIAGNOSIS — G4700 Insomnia, unspecified: Secondary | ICD-10-CM | POA: Insufficient documentation

## 2019-06-18 LAB — CBC WITH DIFFERENTIAL/PLATELET
Basophils Absolute: 0 10*3/uL (ref 0.0–0.2)
Basos: 1 %
EOS (ABSOLUTE): 0 10*3/uL (ref 0.0–0.4)
Eos: 1 %
Hematocrit: 48.4 % (ref 37.5–51.0)
Hemoglobin: 16.3 g/dL (ref 13.0–17.7)
Immature Grans (Abs): 0 10*3/uL (ref 0.0–0.1)
Immature Granulocytes: 0 %
Lymphocytes Absolute: 1.6 10*3/uL (ref 0.7–3.1)
Lymphs: 25 %
MCH: 29.4 pg (ref 26.6–33.0)
MCHC: 33.7 g/dL (ref 31.5–35.7)
MCV: 87 fL (ref 79–97)
Monocytes Absolute: 0.5 10*3/uL (ref 0.1–0.9)
Monocytes: 8 %
Neutrophils Absolute: 4.4 10*3/uL (ref 1.4–7.0)
Neutrophils: 65 %
Platelets: 183 10*3/uL (ref 150–450)
RBC: 5.54 x10E6/uL (ref 4.14–5.80)
RDW: 13.4 % (ref 11.6–15.4)
WBC: 6.6 10*3/uL (ref 3.4–10.8)

## 2019-06-18 LAB — COMPREHENSIVE METABOLIC PANEL
ALT: 34 IU/L (ref 0–44)
AST: 32 IU/L (ref 0–40)
Albumin/Globulin Ratio: 1.9 (ref 1.2–2.2)
Albumin: 4.6 g/dL (ref 4.0–5.0)
Alkaline Phosphatase: 92 IU/L (ref 39–117)
BUN/Creatinine Ratio: 17 (ref 9–20)
BUN: 16 mg/dL (ref 6–20)
Bilirubin Total: 0.5 mg/dL (ref 0.0–1.2)
CO2: 25 mmol/L (ref 20–29)
Calcium: 9.1 mg/dL (ref 8.7–10.2)
Chloride: 103 mmol/L (ref 96–106)
Creatinine, Ser: 0.94 mg/dL (ref 0.76–1.27)
GFR calc Af Amer: 118 mL/min/{1.73_m2} (ref >59)
GFR calc non Af Amer: 102 mL/min/{1.73_m2} (ref >59)
Globulin, Total: 2.4 g/dL (ref 1.5–4.5)
Glucose: 77 mg/dL (ref 65–99)
Potassium: 4.3 mmol/L (ref 3.5–5.2)
Sodium: 139 mmol/L (ref 134–144)
Total Protein: 7 g/dL (ref 6.0–8.5)

## 2019-06-18 LAB — HEPB+HEPC+HIV PANEL
HIV Screen 4th Generation wRfx: NONREACTIVE
Hep B C IgM: NEGATIVE
Hep B Core Total Ab: NEGATIVE
Hep B E Ab: NEGATIVE
Hep B E Ag: NEGATIVE
Hep B Surface Ab, Qual: NONREACTIVE
Hep C Virus Ab: 0.1 s/co ratio (ref 0.0–0.9)
Hepatitis B Surface Ag: NEGATIVE

## 2019-06-18 LAB — LIPID PANEL
Chol/HDL Ratio: 3.3 ratio (ref 0.0–5.0)
Cholesterol, Total: 136 mg/dL (ref 100–199)
HDL: 41 mg/dL (ref >39)
LDL Calculated: 68 mg/dL (ref 0–99)
Triglycerides: 137 mg/dL (ref 0–149)
VLDL Cholesterol Cal: 27 mg/dL (ref 5–40)

## 2019-06-18 LAB — VITAMIN B12: Vitamin B-12: 483 pg/mL (ref 232–1245)

## 2019-06-18 LAB — TSH: TSH: 2.54 u[IU]/mL (ref 0.450–4.500)

## 2019-06-18 LAB — VITAMIN D 25 HYDROXY (VIT D DEFICIENCY, FRACTURES): Vit D, 25-Hydroxy: 37.5 ng/mL (ref 30.0–100.0)

## 2019-06-22 LAB — CHLAMYDIA/GC NAA, CONFIRMATION
Chlamydia trachomatis, NAA: NEGATIVE
Neisseria gonorrhoeae, NAA: NEGATIVE

## 2019-06-23 ENCOUNTER — Telehealth: Payer: Self-pay

## 2019-06-30 NOTE — Telephone Encounter (Signed)
Note not needed 

## 2019-07-18 ENCOUNTER — Telehealth: Payer: Self-pay | Admitting: *Deleted

## 2019-07-18 ENCOUNTER — Other Ambulatory Visit: Payer: Self-pay | Admitting: Nurse Practitioner

## 2019-07-18 ENCOUNTER — Telehealth: Payer: Self-pay | Admitting: Oncology

## 2019-07-18 DIAGNOSIS — D509 Iron deficiency anemia, unspecified: Secondary | ICD-10-CM

## 2019-07-18 NOTE — Telephone Encounter (Signed)
Per Ned Card, NP, called pt regarding collecting UA and submitting stool card. Pt verbalized understanding. Scheduling message sent.

## 2019-07-18 NOTE — Telephone Encounter (Signed)
Scheduled appt per 9/4 sch message - unable to reach pt - left message with apt date and time

## 2019-07-22 ENCOUNTER — Other Ambulatory Visit: Payer: Self-pay

## 2019-07-22 ENCOUNTER — Inpatient Hospital Stay: Payer: Self-pay | Attending: Oncology

## 2019-07-22 DIAGNOSIS — C6211 Malignant neoplasm of descended right testis: Secondary | ICD-10-CM | POA: Insufficient documentation

## 2019-07-22 DIAGNOSIS — D509 Iron deficiency anemia, unspecified: Secondary | ICD-10-CM

## 2019-07-22 LAB — URINALYSIS, COMPLETE (UACMP) WITH MICROSCOPIC
Bacteria, UA: NONE SEEN
Bilirubin Urine: NEGATIVE
Glucose, UA: NEGATIVE mg/dL
Hgb urine dipstick: NEGATIVE
Ketones, ur: NEGATIVE mg/dL
Leukocytes,Ua: NEGATIVE
Nitrite: NEGATIVE
Protein, ur: NEGATIVE mg/dL
Specific Gravity, Urine: 1.026 (ref 1.005–1.030)
pH: 5 (ref 5.0–8.0)

## 2019-07-28 ENCOUNTER — Telehealth: Payer: Self-pay | Admitting: *Deleted

## 2019-07-28 NOTE — Telephone Encounter (Signed)
-----   Message from Owens Shark, NP sent at 07/25/2019  4:51 PM EDT ----- Please call and remind him to submit stool cards.  Thanks

## 2019-07-28 NOTE — Telephone Encounter (Signed)
Per Ned Card, NP, called pt to remind about submitting stool cards. Called emergency contacts and home number, both were no longer in service. Also called sister from emergency contact, phone continued to ring. Was not able to leave vmail.

## 2019-11-14 DIAGNOSIS — B342 Coronavirus infection, unspecified: Secondary | ICD-10-CM

## 2019-11-14 HISTORY — DX: Coronavirus infection, unspecified: B34.2

## 2019-12-19 ENCOUNTER — Ambulatory Visit: Payer: Self-pay | Admitting: Family Medicine

## 2019-12-31 ENCOUNTER — Ambulatory Visit (INDEPENDENT_AMBULATORY_CARE_PROVIDER_SITE_OTHER): Payer: Self-pay | Admitting: Family Medicine

## 2019-12-31 ENCOUNTER — Other Ambulatory Visit: Payer: Self-pay

## 2019-12-31 ENCOUNTER — Encounter: Payer: Self-pay | Admitting: Family Medicine

## 2019-12-31 VITALS — BP 106/73 | HR 72 | Temp 98.4°F | Ht 67.0 in | Wt 205.8 lb

## 2019-12-31 DIAGNOSIS — R29898 Other symptoms and signs involving the musculoskeletal system: Secondary | ICD-10-CM

## 2019-12-31 DIAGNOSIS — Z0000 Encounter for general adult medical examination without abnormal findings: Secondary | ICD-10-CM

## 2019-12-31 DIAGNOSIS — R0602 Shortness of breath: Secondary | ICD-10-CM

## 2019-12-31 DIAGNOSIS — Z09 Encounter for follow-up examination after completed treatment for conditions other than malignant neoplasm: Secondary | ICD-10-CM

## 2019-12-31 DIAGNOSIS — Z8616 Personal history of COVID-19: Secondary | ICD-10-CM

## 2019-12-31 DIAGNOSIS — Z8547 Personal history of malignant neoplasm of testis: Secondary | ICD-10-CM

## 2019-12-31 DIAGNOSIS — H6123 Impacted cerumen, bilateral: Secondary | ICD-10-CM

## 2019-12-31 LAB — POCT GLYCOSYLATED HEMOGLOBIN (HGB A1C): Hemoglobin A1C: 5.3 % (ref 4.0–5.6)

## 2019-12-31 LAB — POCT URINALYSIS DIPSTICK
Bilirubin, UA: NEGATIVE
Blood, UA: NEGATIVE
Glucose, UA: NEGATIVE
Ketones, UA: NEGATIVE
Leukocytes, UA: NEGATIVE
Nitrite, UA: NEGATIVE
Protein, UA: NEGATIVE
Spec Grav, UA: 1.015 (ref 1.010–1.025)
Urobilinogen, UA: 0.2 E.U./dL
pH, UA: 5.5 (ref 5.0–8.0)

## 2019-12-31 LAB — GLUCOSE, POCT (MANUAL RESULT ENTRY): POC Glucose: 107 mg/dl — AB (ref 70–99)

## 2019-12-31 MED ORDER — ALBUTEROL SULFATE HFA 108 (90 BASE) MCG/ACT IN AERS: 2.0000 | INHALATION_SPRAY | Freq: Four times a day (QID) | RESPIRATORY_TRACT | 3 refills | Status: DC | PRN

## 2019-12-31 NOTE — Progress Notes (Signed)
Patient [name]   Established Patient Office Visit  Subjective:  Patient ID: Keith Price, male    DOB: 08-Apr-1981  Age: 39 y.o. MRN: [id number]  CC:  Chief Complaint  Patient presents with  . Follow-up    HPI Keith Price is a 39 year old male who presents for Follow Up today.   Past Medical History:  Diagnosis Date  . Ankle fracture, right 07/02/2018  . Fever blister   . History of chemotherapy 2011  . History of testicular cancer 2011   Current Status: Since his last office visit, he has c/o occasional shortness of breath since testing positive for Coronavirus a few months ago. He also has experienced weakness in his arms and legs. He also has c/o left impaction X 3 weeks now. He has been using OTC earwax removal with no relief of symptoms. He denies fevers, chills, fatigue, recent infections, weight loss, and night sweats. He has not had any headaches, visual changes, dizziness, and falls. No chest pain, heart palpitations, cough and shortness of breath reported. No reports of GI problems such as nausea, vomiting, diarrhea, and constipation. He has no reports of blood in stools, dysuria and hematuria. No depression or anxiety reported today. He denies suicidal ideations, homicidal ideations, or auditory hallucinations. He denies pain today.   Past Surgical History:  Procedure Laterality Date  . CYSTOSCOPY    . CYSTOSCOPY W/ URETERAL STENT PLACEMENT Right 03/16/2010  . ORCHIECTOMY Right 03/16/2010  . ORIF ANKLE FRACTURE Right 07/08/2018   Procedure: OPEN REDUCTION INTERNAL FIXATION (ORIF) ANKLE FRACTURE;  Surgeon: Hiram Gash, MD;  Location: [facility];  Service: Orthopedics;  Laterality: Right;  . SYNDESMOSIS REPAIR Right 07/08/2018   Procedure: SYNDESMOSIS REPAIR RIGHT ANKLE;  Surgeon: Hiram Gash, MD;  Location: [facility];  Service: Orthopedics;  Laterality: Right;    Family History    Problem Relation Age of Onset  . Diabetes Father   . Diabetes Sister   . Diabetes Brother     Social History   Socioeconomic History  . Marital status: Married    Spouse name: Not on file  . Number of children: 3  . Years of education: Not on file  . Highest education level: Not on file  Occupational History  . Not on file  Tobacco Use  . Smoking status: Never Smoker  . Smokeless tobacco: Never Used  Substance and Sexual Activity  . Alcohol use: No  . Drug use: No  . Sexual activity: Yes  Other Topics Concern  . Not on file  Social History Narrative  . Not on file   Social Determinants of Health   Financial Resource Strain:   . Difficulty of Paying Living Expenses: Not on file  Food Insecurity:   . Worried About Charity fundraiser in the Last Year: Not on file  . Ran Out of Food in the Last Year: Not on file  Transportation Needs:   . Lack of Transportation (Medical): Not on file  . Lack of Transportation (Non-Medical): Not on file  Physical Activity:   . Days of Exercise per Week: Not on file  . Minutes of Exercise per Session: Not on file  Stress:   . Feeling of Stress : Not on file  Social Connections:   . Frequency of Communication with Friends and Family: Not on file  . Frequency of Social Gatherings with Friends and Family: Not on file  . Attends  Religious Services: Not on file  . Active Member of Clubs or Organizations: Not on file  . Attends Archivist Meetings: Not on file  . Marital Status: Not on file  Intimate Partner Violence:   . Fear of Current or Ex-Partner: Not on file  . Emotionally Abused: Not on file  . Physically Abused: Not on file  . Sexually Abused: Not on file    Outpatient Medications Prior to Visit  Medication Sig Dispense Refill  . b complex vitamins tablet Take 1 tablet by mouth daily.    . cholecalciferol (VITAMIN D) 1000 units tablet Take by mouth daily.    Marland Kitchen glucosamine-chondroitin 500-400 MG tablet Take 2  tablets by mouth 2 (two) times daily.    Marland Kitchen omeprazole (PRILOSEC) 20 MG capsule Take 1 capsule (20 mg total) by mouth daily for 14 days. 14 capsule 0  . traZODone (DESYREL) 50 MG tablet Take 0.5-1 tablets (25-50 mg total) by mouth at bedtime as needed for sleep. 30 tablet 2  . valACYclovir (VALTREX) 500 MG tablet Take 1 tablet (500 mg total) by mouth daily. 30 tablet 2  . vitamin C (ASCORBIC ACID) 500 MG tablet Take 500 mg by mouth daily.     No facility-administered medications prior to visit.    No Known Allergies  ROS Review of Systems  Constitutional: Negative.   HENT: Negative.        Ear impaction  Eyes: Negative.   Respiratory: Positive for shortness of breath (occasional since past dx of Coronavirus).   Cardiovascular: Negative.   Gastrointestinal: Negative.   Endocrine: Negative.   Genitourinary: Negative.   Musculoskeletal: Negative.   Skin: Negative.   Allergic/Immunologic: Negative.   Neurological: Negative.   Hematological: Negative.   Psychiatric/Behavioral: Negative.       Objective:    Physical Exam  Constitutional: He is oriented to person, place, and time. He appears well-developed and well-nourished.  HENT:  Head: Normocephalic and atraumatic.  Decreased hearing bilaterally r/t cerumen impaction  Eyes: Conjunctivae are normal.  Cardiovascular: Normal rate, regular rhythm, normal heart sounds and intact distal pulses.  Pulmonary/Chest: Effort normal and breath sounds normal.  Abdominal: Soft. Bowel sounds are normal.  Musculoskeletal:        General: Normal range of motion.     Cervical back: Normal range of motion and neck supple.  Neurological: He is alert and oriented to person, place, and time. He has normal reflexes.  Skin: Skin is warm and dry.  Psychiatric: He has a normal mood and affect. His behavior is normal. Judgment and thought content normal.  Nursing note and vitals reviewed.   BP 106/73   Pulse 72   Temp 98.4 F (36.9 C)   Ht 5'  7" (1.702 m)   Wt 205 lb 12.8 oz (93.4 kg)   SpO2 100%   BMI 32.23 kg/m  Wt Readings from Last 3 Encounters:  12/31/19 205 lb 12.8 oz (93.4 kg)  06/17/19 197 lb (89.4 kg)  05/06/19 198 lb 12.8 oz (90.2 kg)     Health Maintenance Due  Topic Date Due  . INFLUENZA VACCINE  06/14/2019    There are no preventive care reminders to display for this patient.  Lab Results  Component Value Date   TSH 2.540 06/17/2019   Lab Results  Component Value Date   WBC 6.6 06/17/2019   HGB 16.3 06/17/2019   HCT 48.4 06/17/2019   MCV 87 06/17/2019   PLT 183 06/17/2019   Lab Results  Component Value Date   NA 139 06/17/2019   K 4.3 06/17/2019   CO2 25 06/17/2019   GLUCOSE 77 06/17/2019   BUN 16 06/17/2019   CREATININE 0.94 06/17/2019   BILITOT 0.5 06/17/2019   ALKPHOS 92 06/17/2019   AST 32 06/17/2019   ALT 34 06/17/2019   PROT 7.0 06/17/2019   ALBUMIN 4.6 06/17/2019   CALCIUM 9.1 06/17/2019   Lab Results  Component Value Date   CHOL 136 06/17/2019   Lab Results  Component Value Date   HDL 41 06/17/2019   Lab Results  Component Value Date   LDLCALC 68 06/17/2019   Lab Results  Component Value Date   TRIG 137 06/17/2019   Lab Results  Component Value Date   CHOLHDL 3.3 06/17/2019   Lab Results  Component Value Date   HGBA1C 5.3 12/31/2019      Assessment & Plan:   1. History of 2019 novel coronavirus disease (COVID-19) Resolved. No signs or symptoms of recurrence noted or reported.  - albuterol (VENTOLIN HFA) 108 (90 Base) MCG/ACT inhaler; Inhale 2 puffs into the lungs every 6 (six) hours as needed for wheezing or shortness of breath.  Dispense: 8 g; Refill: 3  2. Upper extremity weakness - Vitamin B12 - Vitamin D, 25-hydroxy  3. Shortness of breath No signs of respiratory distress noted or reported today.  - albuterol (VENTOLIN HFA) 108 (90 Base) MCG/ACT inhaler; Inhale 2 puffs into the lungs every 6 (six) hours as needed for wheezing or shortness of  breath.  Dispense: 8 g; Refill: 3  4. Bilateral hearing loss due to cerumen impaction Unable to remove cerumen from ears today. Patient tolerated procedure well. Inner ear canal mild redness noted post procedure. Patient to place mineral oil onto cotton ball and insert into his ear canal weekly, to possibly prevent future incidents of earwax buildup.  - Ear Lavage - Referral to ENT  5. Bilateral cerumen impaction  6. History of testicular cancer  7. Healthcare maintenance - POCT glucose (manual entry) - POCT urinalysis dipstick - POCT glycosylated hemoglobin (Hb A1C) - Vitamin B12 - Vitamin D, 25-hydroxy  8. Follow up He will follow up in 6 months.   Meds ordered this encounter  Medications  . albuterol (VENTOLIN HFA) 108 (90 Base) MCG/ACT inhaler    Sig: Inhale 2 puffs into the lungs every 6 (six) hours as needed for wheezing or shortness of breath.    Dispense:  8 g    Refill:  3    Orders Placed This Encounter  Procedures  . Vitamin B12  . Vitamin D, 25-hydroxy  . POCT glucose (manual entry)  . POCT urinalysis dipstick  . POCT glycosylated hemoglobin (Hb A1C)  . Ear Lavage    Referral Orders  No referral(s) requested today    Kathe Becton,  MSN, FNP-BC [address], [place] [address] [phone] fax   Problem List Items Addressed This Visit    None    Visit Diagnoses    History of 2019 novel coronavirus disease (COVID-19)    -  Primary   Relevant Medications   albuterol (VENTOLIN HFA) 108 (90 Base) MCG/ACT inhaler   Upper extremity weakness       Relevant Orders   Vitamin B12   Vitamin D, 25-hydroxy   Shortness of breath       Relevant Medications   albuterol (VENTOLIN HFA) 108 (90 Base) MCG/ACT  inhaler   Bilateral hearing loss due to cerumen impaction       Relevant Orders   Ear Lavage   History of testicular cancer       Healthcare  maintenance       Relevant Orders   POCT glucose (manual entry) (Completed)   POCT urinalysis dipstick (Completed)   POCT glycosylated hemoglobin (Hb A1C) (Completed)   Vitamin B12   Vitamin D, 25-hydroxy   Follow up          Meds ordered this encounter  Medications  . albuterol (VENTOLIN HFA) 108 (90 Base) MCG/ACT inhaler    Sig: Inhale 2 puffs into the lungs every 6 (six) hours as needed for wheezing or shortness of breath.    Dispense:  8 g    Refill:  3    Follow-up: Return in about 6 months (around 06/29/2020).    Azzie Glatter, FNP

## 2020-01-01 LAB — VITAMIN D 25 HYDROXY (VIT D DEFICIENCY, FRACTURES): Vit D, 25-Hydroxy: 29.7 ng/mL — ABNORMAL LOW (ref 30.0–100.0)

## 2020-01-01 LAB — VITAMIN B12: Vitamin B-12: 353 pg/mL (ref 232–1245)

## 2020-05-05 ENCOUNTER — Other Ambulatory Visit: Payer: Self-pay | Admitting: *Deleted

## 2020-05-05 DIAGNOSIS — C6211 Malignant neoplasm of descended right testis: Secondary | ICD-10-CM

## 2020-05-05 DIAGNOSIS — D509 Iron deficiency anemia, unspecified: Secondary | ICD-10-CM

## 2020-05-06 ENCOUNTER — Inpatient Hospital Stay: Payer: Self-pay | Attending: Oncology | Admitting: Oncology

## 2020-05-06 ENCOUNTER — Inpatient Hospital Stay: Payer: Self-pay

## 2020-06-29 ENCOUNTER — Other Ambulatory Visit: Payer: Self-pay

## 2020-06-29 ENCOUNTER — Encounter: Payer: Self-pay | Admitting: Family Medicine

## 2020-06-29 ENCOUNTER — Ambulatory Visit (INDEPENDENT_AMBULATORY_CARE_PROVIDER_SITE_OTHER): Payer: Self-pay | Admitting: Family Medicine

## 2020-06-29 VITALS — BP 117/72 | HR 60 | Temp 97.5°F | Ht 67.0 in | Wt 212.0 lb

## 2020-06-29 DIAGNOSIS — Z09 Encounter for follow-up examination after completed treatment for conditions other than malignant neoplasm: Secondary | ICD-10-CM

## 2020-06-29 DIAGNOSIS — M62830 Muscle spasm of back: Secondary | ICD-10-CM

## 2020-06-29 DIAGNOSIS — Z0000 Encounter for general adult medical examination without abnormal findings: Secondary | ICD-10-CM

## 2020-06-29 DIAGNOSIS — R29898 Other symptoms and signs involving the musculoskeletal system: Secondary | ICD-10-CM

## 2020-06-29 LAB — POCT URINALYSIS DIPSTICK (MANUAL)
Poct Bilirubin: NEGATIVE
Poct Blood: NEGATIVE
Poct Glucose: NORMAL mg/dL
Poct Ketones: NEGATIVE
Poct Urobilinogen: NORMAL mg/dL
Spec Grav, UA: 1.025 (ref 1.010–1.025)
pH, UA: 7 (ref 5.0–8.0)

## 2020-06-29 MED ORDER — CYCLOBENZAPRINE HCL 10 MG PO TABS: 10.0000 mg | ORAL_TABLET | Freq: Three times a day (TID) | ORAL | 3 refills | Status: AC | PRN

## 2020-06-29 NOTE — Progress Notes (Signed)
Patient [facility]   Established Patient Office Visit  Subjective:  Patient ID: Keith Price, male    DOB: 01/30/1981  Age: 39 y.o. MRN: [id number]  CC:  Chief Complaint  Patient presents with  . Follow-up    Right side pain at abdominal, right side arm numbness, right side leg numbness, worser started at  end of July     HPI Keith Price is a 39 year old male who presents for Follow Up today.   Patient Active Problem List   Diagnosis Date Noted  . Genital infection 06/18/2019  . Insomnia 06/18/2019  . Chronic pain of right ankle 11/29/2018  . Flu-like symptoms 11/29/2018  . Testicular cancer ([place]) 04/05/2010  . 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 ABDOMINAL MASS 03/16/2010   Past Medical History:  Diagnosis Date  . Ankle fracture, right 07/02/2018  . Fever blister   . History of chemotherapy 2011  . History of testicular cancer 2011   Current Status: Since his last office visit, he has c/o of recent lower back pain, numbness, and tingling on right side of body X a few weeks now. He denies injury at this time. He does not work a very physical job. He has been taking Acetaminophen for aid in pain relief. He denies fevers, chills, fatigue, recent infections, weight loss, and night sweats. He has not had any headaches, visual changes, dizziness, and falls. No chest pain, heart palpitations, cough and shortness of breath reported. Denies GI problems such as nausea, vomiting, diarrhea, and constipation. He has no reports of blood in stools, dysuria and hematuria. No depression or anxiety reported today. He is taking all medications as prescribed.   Past Surgical History:  Procedure Laterality Date  . CYSTOSCOPY    . CYSTOSCOPY W/ URETERAL STENT PLACEMENT Right 03/16/2010  . ORCHIECTOMY Right 03/16/2010  . ORIF ANKLE FRACTURE Right 07/08/2018   Procedure: OPEN REDUCTION INTERNAL FIXATION (ORIF) ANKLE FRACTURE;  Surgeon:  Hiram Gash, MD;  Location: [facility];  Service: Orthopedics;  Laterality: Right;  . SYNDESMOSIS REPAIR Right 07/08/2018   Procedure: SYNDESMOSIS REPAIR RIGHT ANKLE;  Surgeon: Hiram Gash, MD;  Location: [facility];  Service: Orthopedics;  Laterality: Right;    Family History  Problem Relation Age of Onset  . Diabetes Father   . Diabetes Sister   . Diabetes Brother     Social History   Socioeconomic History  . Marital status: Married    Spouse name: Not on file  . Number of children: 3  . Years of education: Not on file  . Highest education level: Not on file  Occupational History  . Not on file  Tobacco Use  . Smoking status: Never Smoker  . Smokeless tobacco: Never Used  Vaping Use  . Vaping Use: Never used  Substance and Sexual Activity  . Alcohol use: No  . Drug use: No  . Sexual activity: Yes  Other Topics Concern  . Not on file  Social History Narrative  . Not on file   Social Determinants of Health   Financial Resource Strain:   . Difficulty of Paying Living Expenses:   Food Insecurity:   . Worried About Charity fundraiser in the Last Year:   . Arboriculturist in the Last Year:   Transportation Needs:   . Film/video editor (Medical):   Marland Kitchen Lack of Transportation (Non-Medical):   Physical Activity:   .  Days of Exercise per Week:   . Minutes of Exercise per Session:   Stress:   . Feeling of Stress :   Social Connections:   . Frequency of Communication with Friends and Family:   . Frequency of Social Gatherings with Friends and Family:   . Attends Religious Services:   . Active Member of Clubs or Organizations:   . Attends Archivist Meetings:   Marland Kitchen Marital Status:   Intimate Partner Violence:   . Fear of Current or Ex-Partner:   . Emotionally Abused:   Marland Kitchen Physically Abused:   . Sexually Abused:     Outpatient Medications Prior to Visit  Medication Sig Dispense Refill  . albuterol (VENTOLIN HFA) 108  (90 Base) MCG/ACT inhaler Inhale 2 puffs into the lungs every 6 (six) hours as needed for wheezing or shortness of breath. 8 g 3  . b complex vitamins tablet Take 1 tablet by mouth daily.    . cholecalciferol (VITAMIN D) 1000 units tablet Take by mouth daily.    Marland Kitchen glucosamine-chondroitin 500-400 MG tablet Take 2 tablets by mouth 2 (two) times daily.    . traZODone (DESYREL) 50 MG tablet Take 0.5-1 tablets (25-50 mg total) by mouth at bedtime as needed for sleep. 30 tablet 2  . valACYclovir (VALTREX) 500 MG tablet Take 1 tablet (500 mg total) by mouth daily. 30 tablet 2  . vitamin C (ASCORBIC ACID) 500 MG tablet Take 500 mg by mouth daily.    Marland Kitchen omeprazole (PRILOSEC) 20 MG capsule Take 1 capsule (20 mg total) by mouth daily for 14 days. 14 capsule 0   No facility-administered medications prior to visit.    No Known Allergies  ROS Review of Systems  Constitutional: Negative.   HENT: Negative.   Eyes: Negative.   Respiratory: Negative.   Cardiovascular: Negative.   Gastrointestinal: Negative.   Endocrine: Negative.   Genitourinary: Negative.   Musculoskeletal: Positive for back pain.  Allergic/Immunologic: Negative.   Neurological: Negative.   Hematological: Negative.   Psychiatric/Behavioral: Negative.       Objective:    Physical Exam Vitals and nursing note reviewed.  Constitutional:      Appearance: Normal appearance.  HENT:     Head: Normocephalic and atraumatic.     Nose: Nose normal.     Mouth/Throat:     Mouth: Mucous membranes are moist.     Pharynx: Oropharynx is clear.  Cardiovascular:     Rate and Rhythm: Normal rate and regular rhythm.     Pulses: Normal pulses.     Heart sounds: Normal heart sounds.  Pulmonary:     Effort: Pulmonary effort is normal.     Breath sounds: Normal breath sounds.  Abdominal:     General: Bowel sounds are normal.     Palpations: Abdomen is soft.  Musculoskeletal:        General: Normal range of motion.     Cervical back:  Normal range of motion and neck supple.  Skin:    General: Skin is warm and dry.  Neurological:     General: No focal deficit present.     Mental Status: He is alert and oriented to person, place, and time.  Psychiatric:        Mood and Affect: Mood normal.        Behavior: Behavior normal.        Thought Content: Thought content normal.        Judgment: Judgment normal.  BP 117/72   Pulse 60   Temp (!) 97.5 F (36.4 C) (Temporal)   Ht 5\' 7"  (1.702 m)   Wt 212 lb (96.2 kg)   SpO2 97%   BMI 33.20 kg/m  Wt Readings from Last 3 Encounters:  06/29/20 212 lb (96.2 kg)  12/31/19 205 lb 12.8 oz (93.4 kg)  06/17/19 197 lb (89.4 kg)     Health Maintenance Due  Topic Date Due  . COVID-19 Vaccine (1) Never done  . INFLUENZA VACCINE  06/13/2020    There are no preventive care reminders to display for this patient.  Lab Results  Component Value Date   TSH 2.540 06/17/2019   Lab Results  Component Value Date   WBC 6.6 06/17/2019   HGB 16.3 06/17/2019   HCT 48.4 06/17/2019   MCV 87 06/17/2019   PLT 183 06/17/2019   Lab Results  Component Value Date   NA 139 06/17/2019   K 4.3 06/17/2019   CO2 25 06/17/2019   GLUCOSE 77 06/17/2019   BUN 16 06/17/2019   CREATININE 0.94 06/17/2019   BILITOT 0.5 06/17/2019   ALKPHOS 92 06/17/2019   AST 32 06/17/2019   ALT 34 06/17/2019   PROT 7.0 06/17/2019   ALBUMIN 4.6 06/17/2019   CALCIUM 9.1 06/17/2019   Lab Results  Component Value Date   CHOL 136 06/17/2019   Lab Results  Component Value Date   HDL 41 06/17/2019   Lab Results  Component Value Date   LDLCALC 68 06/17/2019   Lab Results  Component Value Date   TRIG 137 06/17/2019   Lab Results  Component Value Date   CHOLHDL 3.3 06/17/2019   Lab Results  Component Value Date   HGBA1C 5.3 12/31/2019      Assessment & Plan:   1. Muscle spasm of back - cyclobenzaprine (FLEXERIL) 10 MG tablet; Take 1 tablet (10 mg total) by mouth 3 (three) times daily as  needed for muscle spasms.  Dispense: 30 tablet; Refill: 3  2. Weakness of right lower extremity Basic neurology test performed today with negative results.   3. Healthcare maintenance - POCT Urinalysis Dip Manual  4. Follow up He will follow up in 6 months.   Meds ordered this encounter  Medications  . cyclobenzaprine (FLEXERIL) 10 MG tablet    Sig: Take 1 tablet (10 mg total) by mouth 3 (three) times daily as needed for muscle spasms.    Dispense:  30 tablet    Refill:  3    Orders Placed This Encounter  Procedures  . POCT Urinalysis Dip Manual    Referral Orders  No referral(s) requested today     Problem List Items Addressed This Visit    None    Visit Diagnoses    Muscle spasm of back    -  Primary   Relevant Medications   cyclobenzaprine (FLEXERIL) 10 MG tablet   Weakness of right lower extremity       Healthcare maintenance       Relevant Orders   POCT Urinalysis Dip Manual (Completed)   Follow up          Meds ordered this encounter  Medications  . cyclobenzaprine (FLEXERIL) 10 MG tablet    Sig: Take 1 tablet (10 mg total) by mouth 3 (three) times daily as needed for muscle spasms.    Dispense:  30 tablet    Refill:  3    Follow-up: Return in about 6 months (around 12/30/2020).  Azzie Glatter, FNP

## 2020-06-29 NOTE — Patient Instructions (Signed)
Cyclobenzaprine tablets What is this medicine? CYCLOBENZAPRINE (sye kloe BEN za preen) is a muscle relaxer. It is used to treat muscle pain, spasms, and stiffness. This medicine may be used for other purposes; ask your health care provider or pharmacist if you have questions. COMMON BRAND NAME(S): Fexmid, Flexeril What should I tell my health care provider before I take this medicine? They need to know if you have any of these conditions:  heart disease, irregular heartbeat, or previous heart attack  liver disease  thyroid problem  an unusual or allergic reaction to cyclobenzaprine, tricyclic antidepressants, lactose, other medicines, foods, dyes, or preservatives  pregnant or trying to get pregnant  breast-feeding How should I use this medicine? Take this medicine by mouth with a glass of water. Follow the directions on the prescription label. If this medicine upsets your stomach, take it with food or milk. Take your medicine at regular intervals. Do not take it more often than directed. Talk to your pediatrician regarding the use of this medicine in children. Special care may be needed. Overdosage: If you think you have taken too much of this medicine contact a poison control center or emergency room at once. NOTE: This medicine is only for you. Do not share this medicine with others. What if I miss a dose? If you miss a dose, take it as soon as you can. If it is almost time for your next dose, take only that dose. Do not take double or extra doses. What may interact with this medicine? Do not take this medicine with any of the following medications:  MAOIs like Carbex, Eldepryl, Marplan, Nardil, and Parnate  narcotic medicines for cough  safinamide This medicine may also interact with the following medications:  alcohol  bupropion  antihistamines for allergy, cough and cold  certain medicines for anxiety or sleep  certain medicines for bladder problems like oxybutynin,  tolterodine  certain medicines for depression like amitriptyline, fluoxetine, sertraline  certain medicines for Parkinson's disease like benztropine, trihexyphenidyl  certain medicines for seizures like phenobarbital, primidone  certain medicines for stomach problems like dicyclomine, hyoscyamine  certain medicines for travel sickness like scopolamine  general anesthetics like halothane, isoflurane, methoxyflurane, propofol  ipratropium  local anesthetics like lidocaine, pramoxine, tetracaine  medicines that relax muscles for surgery  narcotic medicines for pain  phenothiazines like chlorpromazine, mesoridazine, prochlorperazine, thioridazine  verapamil This list may not describe all possible interactions. Give your health care provider a list of all the medicines, herbs, non-prescription drugs, or dietary supplements you use. Also tell them if you smoke, drink alcohol, or use illegal drugs. Some items may interact with your medicine. What should I watch for while using this medicine? Tell your doctor or health care professional if your symptoms do not start to get better or if they get worse. You may get drowsy or dizzy. Do not drive, use machinery, or do anything that needs mental alertness until you know how this medicine affects you. Do not stand or sit up quickly, especially if you are an older patient. This reduces the risk of dizzy or fainting spells. Alcohol may interfere with the effect of this medicine. Avoid alcoholic drinks. If you are taking another medicine that also causes drowsiness, you may have more side effects. Give your health care provider a list of all medicines you use. Your doctor will tell you how much medicine to take. Do not take more medicine than directed. Call emergency for help if you have problems breathing or unusual sleepiness.  Your mouth may get dry. Chewing sugarless gum or sucking hard candy, and drinking plenty of water may help. Contact your  doctor if the problem does not go away or is severe. What side effects may I notice from receiving this medicine? Side effects that you should report to your doctor or health care professional as soon as possible:  allergic reactions like skin rash, itching or hives, swelling of the face, lips, or tongue  breathing problems  chest pain  fast, irregular heartbeat  hallucinations  seizures  unusually weak or tired Side effects that usually do not require medical attention (report to your doctor or health care professional if they continue or are bothersome):  headache  nausea, vomiting This list may not describe all possible side effects. Call your doctor for medical advice about side effects. You may report side effects to FDA at 1-800-FDA-1088. Where should I keep my medicine? Keep out of the reach of children. Store at room temperature between 15 and 30 degrees C (59 and 86 degrees F). Keep container tightly closed. Throw away any unused medicine after the expiration date. NOTE: This sheet is a summary. It may not cover all possible information. If you have questions about this medicine, talk to your doctor, pharmacist, or health care provider.  2020 Elsevier/Gold Standard (2018-10-02 12:49:26) Muscle Cramps and Spasms Muscle cramps and spasms are when muscles tighten by themselves. They usually get better within minutes. Muscle cramps are painful. They are usually stronger and last longer than muscle spasms. Muscle spasms may or may not be painful. They can last a few seconds or much longer. Cramps and spasms can affect any muscle, but they occur most often in the calf muscles of the leg. They are usually not caused by a serious problem. In many cases, the cause is not known. Some common causes include:  Doing more physical work or exercise than your body is ready for.  Using the muscles too much (overuse) by repeating certain movements too many times.  Staying in a certain  position for a long time.  Playing a sport or doing an activity without preparing properly.  Using bad form or technique while playing a sport or doing an activity.  Not having enough water in your body (dehydration).  Injury.  Side effects of some medicines.  Low levels of the salts and minerals in your blood (electrolytes), such as low potassium or calcium. Follow these instructions at home: Managing pain and stiffness      Massage, stretch, and relax the muscle. Do this for many minutes at a time.  If told, put heat on tight or tense muscles as often as told by your doctor. Use the heat source that your doctor recommends, such as a moist heat pack or a heating pad. ? Place a towel between your skin and the heat source. ? Leave the heat on for 20-30 minutes. ? Remove the heat if your skin turns bright red. This is very important if you are not able to feel pain, heat, or cold. You may have a greater risk of getting burned.  If told, put ice on the affected area. This may help if you are sore or have pain after a cramp or spasm. ? Put ice in a plastic bag. ? Place a towel between your skin and the bag. ? Leave the ice on for 20 minutes, 2-3 times a day.  Try taking hot showers or baths to help relax tight muscles. Eating and drinking    Drink enough fluid to keep your pee (urine) pale yellow.  Eat a healthy diet to help ensure that your muscles work well. This should include: ? Fruits and vegetables. ? Lean protein. ? Whole grains. ? Low-fat or nonfat dairy products. General instructions  If you are having cramps often, avoid intense exercise for several days.  Take over-the-counter and prescription medicines only as told by your doctor.  Watch for any changes in your symptoms.  Keep all follow-up visits as told by your doctor. This is important. Contact a doctor if:  Your cramps or spasms get worse or happen more often.  Your cramps or spasms do not get better  with time. Summary  Muscle cramps and spasms are when muscles tighten by themselves. They usually get better within minutes.  Cramps and spasms occur most often in the calf muscles of the leg.  Massage, stretch, and relax the muscle. This may help the cramp or spasm go away.  Drink enough fluid to keep your pee (urine) pale yellow. This information is not intended to replace advice given to you by your health care provider. Make sure you discuss any questions you have with your health care provider. Document Revised: 03/25/2018 Document Reviewed: 03/25/2018 Elsevier Patient Education  2020 Elsevier Inc.  

## 2020-12-29 ENCOUNTER — Other Ambulatory Visit: Payer: Self-pay

## 2020-12-29 ENCOUNTER — Ambulatory Visit (INDEPENDENT_AMBULATORY_CARE_PROVIDER_SITE_OTHER): Payer: Self-pay | Admitting: Family Medicine

## 2020-12-29 ENCOUNTER — Encounter: Payer: Self-pay | Admitting: Family Medicine

## 2020-12-29 VITALS — BP 114/65 | HR 66 | Temp 97.3°F | Ht 67.0 in | Wt 214.0 lb

## 2020-12-29 DIAGNOSIS — Z0000 Encounter for general adult medical examination without abnormal findings: Secondary | ICD-10-CM

## 2020-12-29 DIAGNOSIS — D509 Iron deficiency anemia, unspecified: Secondary | ICD-10-CM

## 2020-12-29 DIAGNOSIS — Z789 Other specified health status: Secondary | ICD-10-CM

## 2020-12-29 DIAGNOSIS — Z09 Encounter for follow-up examination after completed treatment for conditions other than malignant neoplasm: Secondary | ICD-10-CM

## 2020-12-29 DIAGNOSIS — Z758 Other problems related to medical facilities and other health care: Secondary | ICD-10-CM

## 2020-12-29 DIAGNOSIS — Z23 Encounter for immunization: Secondary | ICD-10-CM

## 2020-12-29 DIAGNOSIS — Z8547 Personal history of malignant neoplasm of testis: Secondary | ICD-10-CM

## 2020-12-29 LAB — POCT URINALYSIS DIPSTICK
Bilirubin, UA: NEGATIVE
Blood, UA: NEGATIVE
Glucose, UA: NEGATIVE
Ketones, UA: NEGATIVE
Leukocytes, UA: NEGATIVE
Nitrite, UA: NEGATIVE
Protein, UA: NEGATIVE
Spec Grav, UA: 1.025 (ref 1.010–1.025)
Urobilinogen, UA: 0.2 E.U./dL
pH, UA: 6 (ref 5.0–8.0)

## 2020-12-29 NOTE — Progress Notes (Signed)
Patient [facility]   Annual Physical  Subjective:  Patient ID: Keith Price, male    DOB: Apr 28, 1981  Age: 40 y.o. MRN: [id number]  CC:  Chief Complaint  Patient presents with  . Annual Exam    Physical     HPI Keith Price is a 40 year old presents for Annual Physical today.   Patient Active Problem List   Diagnosis Date Noted  . Genital infection 06/18/2019  . Insomnia 06/18/2019  . Chronic pain of right ankle 11/29/2018  . Flu-like symptoms 11/29/2018  . Testicular cancer ([place]) 04/05/2010  . 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 ABDOMINAL MASS 03/16/2010   Current Status: Since his last office visit, he is doing well with no complaints. He states that he has had recent episodes of dizziness, nausea, and increased fatigue intermittent. He denies fevers, chills, recent infections, weight loss, and night sweats. He has not had any headaches, visual changes, and falls. No chest pain, heart palpitations, cough and shortness of breath reported. Denies GI problems such as vomiting, diarrhea, and constipation. He has no reports of blood in stools, dysuria and hematuria. No depression or anxiety reported today. He is taking all medications as prescribed. He denies pain today.   Past Medical History:  Diagnosis Date  . Ankle fracture, right 07/02/2018  . Coronavirus infection 2021  . Fever blister   . History of chemotherapy 2011  . History of testicular cancer 2011  . Iron deficiency anemia     Past Surgical History:  Procedure Laterality Date  . CYSTOSCOPY    . CYSTOSCOPY W/ URETERAL STENT PLACEMENT Right 03/16/2010  . ORCHIECTOMY Right 03/16/2010  . ORIF ANKLE FRACTURE Right 07/08/2018   Procedure: OPEN REDUCTION INTERNAL FIXATION (ORIF) ANKLE FRACTURE;  Surgeon: Hiram Gash, MD;  Location: [facility];  Service: Orthopedics;  Laterality: Right;  . SYNDESMOSIS REPAIR Right 07/08/2018   Procedure:  SYNDESMOSIS REPAIR RIGHT ANKLE;  Surgeon: Hiram Gash, MD;  Location: [facility];  Service: Orthopedics;  Laterality: Right;    Family History  Problem Relation Age of Onset  . Diabetes Father   . Diabetes Sister   . Diabetes Brother     Social History   Socioeconomic History  . Marital status: Married    Spouse name: Not on file  . Number of children: 3  . Years of education: Not on file  . Highest education level: Not on file  Occupational History  . Not on file  Tobacco Use  . Smoking status: Never Smoker  . Smokeless tobacco: Never Used  Vaping Use  . Vaping Use: Never used  Substance and Sexual Activity  . Alcohol use: No  . Drug use: No  . Sexual activity: Yes  Other Topics Concern  . Not on file  Social History Narrative  . Not on file   Social Determinants of Health   Financial Resource Strain: Not on file  Food Insecurity: Not on file  Transportation Needs: Not on file  Physical Activity: Not on file  Stress: Not on file  Social Connections: Not on file  Intimate Partner Violence: Not on file    Outpatient Medications Prior to Visit  Medication Sig Dispense Refill  . albuterol (VENTOLIN HFA) 108 (90 Base) MCG/ACT inhaler Inhale 2 puffs into the lungs every 6 (six) hours as needed for wheezing or shortness of breath. 8 g 3  . b complex vitamins tablet Take 1 tablet by mouth  daily.    . cholecalciferol (VITAMIN D) 1000 units tablet Take by mouth daily.    . cyclobenzaprine (FLEXERIL) 10 MG tablet Take 1 tablet (10 mg total) by mouth 3 (three) times daily as needed for muscle spasms. 30 tablet 3  . glucosamine-chondroitin 500-400 MG tablet Take 2 tablets by mouth 2 (two) times daily.    . traZODone (DESYREL) 50 MG tablet Take 0.5-1 tablets (25-50 mg total) by mouth at bedtime as needed for sleep. 30 tablet 2  . vitamin C (ASCORBIC ACID) 500 MG tablet Take 500 mg by mouth daily.    . valACYclovir (VALTREX) 500 MG tablet Take 1 tablet (500 mg  total) by mouth daily. 30 tablet 2   No facility-administered medications prior to visit.    No Known Allergies  ROS Review of Systems  Constitutional: Negative.   HENT: Negative.   Eyes: Negative.   Respiratory: Negative.   Cardiovascular: Negative.   Gastrointestinal: Positive for constipation (occasional ) and nausea (occasional ).  Endocrine: Negative.   Genitourinary: Negative.   Musculoskeletal: Negative.   Skin: Negative.   Allergic/Immunologic: Negative.   Neurological: Positive for dizziness (occasional ) and headaches (occasional ).  Hematological: Negative.   Psychiatric/Behavioral: Negative.       Objective:    Physical Exam Vitals and nursing note reviewed.  Constitutional:      Appearance: Normal appearance.  HENT:     Head: Normocephalic.     Mouth/Throat:     Mouth: Mucous membranes are moist.     Pharynx: Oropharynx is clear.  Cardiovascular:     Rate and Rhythm: Normal rate and regular rhythm.     Pulses: Normal pulses.     Heart sounds: Normal heart sounds.  Pulmonary:     Effort: Pulmonary effort is normal.     Breath sounds: Normal breath sounds.  Abdominal:     General: Bowel sounds are normal. There is distension (obese).     Palpations: Abdomen is soft.  Musculoskeletal:        General: Normal range of motion.     Cervical back: Normal range of motion and neck supple.  Skin:    General: Skin is warm and dry.  Neurological:     General: No focal deficit present.     Mental Status: He is alert and oriented to person, place, and time.  Psychiatric:        Mood and Affect: Mood normal.        Behavior: Behavior normal.        Thought Content: Thought content normal.        Judgment: Judgment normal.     BP 114/65 (BP Location: Left Arm, Patient Position: Sitting, Cuff Size: Normal)   Pulse 66   Temp (!) 97.3 F (36.3 C) (Temporal)   Ht 5\' 7"  (1.702 m)   Wt 214 lb (97.1 kg)   SpO2 98%   BMI 33.52 kg/m  Wt Readings from Last 3  Encounters:  12/29/20 214 lb (97.1 kg)  06/29/20 212 lb (96.2 kg)  12/31/19 205 lb 12.8 oz (93.4 kg)     Health Maintenance Due  Topic Date Due  . COVID-19 Vaccine (1) Never done    There are no preventive care reminders to display for this patient.  Lab Results  Component Value Date   TSH 2.540 06/17/2019   Lab Results  Component Value Date   WBC 6.6 06/17/2019   HGB 16.3 06/17/2019   HCT 48.4 06/17/2019  MCV 87 06/17/2019   PLT 183 06/17/2019   Lab Results  Component Value Date   NA 139 06/17/2019   K 4.3 06/17/2019   CO2 25 06/17/2019   GLUCOSE 77 06/17/2019   BUN 16 06/17/2019   CREATININE 0.94 06/17/2019   BILITOT 0.5 06/17/2019   ALKPHOS 92 06/17/2019   AST 32 06/17/2019   ALT 34 06/17/2019   PROT 7.0 06/17/2019   ALBUMIN 4.6 06/17/2019   CALCIUM 9.1 06/17/2019   Lab Results  Component Value Date   CHOL 136 06/17/2019   Lab Results  Component Value Date   HDL 41 06/17/2019   Lab Results  Component Value Date   LDLCALC 68 06/17/2019   Lab Results  Component Value Date   TRIG 137 06/17/2019   Lab Results  Component Value Date   CHOLHDL 3.3 06/17/2019   Lab Results  Component Value Date   HGBA1C 5.3 12/31/2019      Assessment & Plan:   1. Annual physical exam Physical assessment within normal for age.  He will perform monthly testicular exams/  Recommend daily multivitamin for men Recommend strength training in 150 minutes of cardiovascular exercise per week - POCT Urinalysis Dipstick  2. Iron deficiency anemia, unspecified iron deficiency anemia type  3. History of testicular cancer Stable. Continue to follow up with Oncology as needed.   4. Need for influenza vaccination - Flu Vaccine QUAD 6+ mos PF IM (Fluarix Quad PF)  5. Health care maintenance - CBC with Differential - Comprehensive metabolic panel - Lipid Panel - TSH - Vitamin B12 - Vitamin D, 25-hydroxy - Iron and TIBC([facility]/Sun[facility]) - Magnesium  6. Follow  up He will follow up in 6 months.   No orders of the defined types were placed in this encounter.   Orders Placed This Encounter  Procedures  . Flu Vaccine QUAD 6+ mos PF IM (Fluarix Quad PF)  . CBC with Differential  . Comprehensive metabolic panel  . Lipid Panel  . TSH  . Vitamin B12  . Vitamin D, 25-hydroxy  . Iron and TIBC([facility]/Sunquest)  . Magnesium  . POCT Urinalysis Dipstick    Referral Orders  No referral(s) requested today    Kathe Becton, MSN, ANE, FNP-BC [facility] [address] [place], [place] [address] [phone] fax  Problem List Items Addressed This Visit   None   Visit Diagnoses    Annual physical exam    -  Primary   Relevant Orders   POCT Urinalysis Dipstick (Completed)   Language barrier       Iron deficiency anemia, unspecified iron deficiency anemia type       History of testicular cancer       Need for influenza vaccination       Relevant Orders   Flu Vaccine QUAD 6+ mos PF IM (Fluarix Quad PF) (Completed)   Health care maintenance       Relevant Orders   CBC with Differential   Comprehensive metabolic panel   Lipid Panel   TSH   Vitamin B12   Vitamin D, 25-hydroxy   Iron and TIBC([facility]/Sunquest)   Magnesium   Follow up          No orders of the defined types were placed in this encounter.   Follow-up: No follow-ups on file.    Azzie Glatter, FNP

## 2020-12-30 LAB — TSH: TSH: 2.77 u[IU]/mL (ref 0.450–4.500)

## 2020-12-30 LAB — COMPREHENSIVE METABOLIC PANEL
ALT: 27 IU/L (ref 0–44)
AST: 21 IU/L (ref 0–40)
Albumin/Globulin Ratio: 1.4 (ref 1.2–2.2)
Albumin: 4.3 g/dL (ref 4.0–5.0)
Alkaline Phosphatase: 122 IU/L — ABNORMAL HIGH (ref 44–121)
BUN/Creatinine Ratio: 13 (ref 9–20)
BUN: 13 mg/dL (ref 6–24)
Bilirubin Total: 0.5 mg/dL (ref 0.0–1.2)
CO2: 22 mmol/L (ref 20–29)
Calcium: 8.9 mg/dL (ref 8.7–10.2)
Chloride: 104 mmol/L (ref 96–106)
Creatinine, Ser: 0.97 mg/dL (ref 0.76–1.27)
GFR calc Af Amer: 112 mL/min/{1.73_m2} (ref >59)
GFR calc non Af Amer: 97 mL/min/{1.73_m2} (ref >59)
Globulin, Total: 3.1 g/dL (ref 1.5–4.5)
Glucose: 103 mg/dL — ABNORMAL HIGH (ref 65–99)
Potassium: 4.5 mmol/L (ref 3.5–5.2)
Sodium: 142 mmol/L (ref 134–144)
Total Protein: 7.4 g/dL (ref 6.0–8.5)

## 2020-12-30 LAB — IRON AND TIBC
Iron Saturation: 26 % (ref 15–55)
Iron: 91 ug/dL (ref 38–169)
Total Iron Binding Capacity: 354 ug/dL (ref 250–450)
UIBC: 263 ug/dL (ref 111–343)

## 2020-12-30 LAB — CBC WITH DIFFERENTIAL/PLATELET
Basophils Absolute: 0.1 10*3/uL (ref 0.0–0.2)
Basos: 1 %
EOS (ABSOLUTE): 0 10*3/uL (ref 0.0–0.4)
Eos: 1 %
Hematocrit: 49.3 % (ref 37.5–51.0)
Hemoglobin: 16.7 g/dL (ref 13.0–17.7)
Immature Grans (Abs): 0 10*3/uL (ref 0.0–0.1)
Immature Granulocytes: 0 %
Lymphocytes Absolute: 1.4 10*3/uL (ref 0.7–3.1)
Lymphs: 25 %
MCH: 29.4 pg (ref 26.6–33.0)
MCHC: 33.9 g/dL (ref 31.5–35.7)
MCV: 87 fL (ref 79–97)
Monocytes Absolute: 0.4 10*3/uL (ref 0.1–0.9)
Monocytes: 8 %
Neutrophils Absolute: 3.7 10*3/uL (ref 1.4–7.0)
Neutrophils: 65 %
Platelets: 188 10*3/uL (ref 150–450)
RBC: 5.68 x10E6/uL (ref 4.14–5.80)
RDW: 13 % (ref 11.6–15.4)
WBC: 5.6 10*3/uL (ref 3.4–10.8)

## 2020-12-30 LAB — LIPID PANEL
Chol/HDL Ratio: 3.8 ratio (ref 0.0–5.0)
Cholesterol, Total: 165 mg/dL (ref 100–199)
HDL: 43 mg/dL (ref >39)
LDL Chol Calc (NIH): 108 mg/dL — ABNORMAL HIGH (ref 0–99)
Triglycerides: 74 mg/dL (ref 0–149)
VLDL Cholesterol Cal: 14 mg/dL (ref 5–40)

## 2020-12-30 LAB — VITAMIN B12: Vitamin B-12: 433 pg/mL (ref 232–1245)

## 2020-12-30 LAB — MAGNESIUM: Magnesium: 2.2 mg/dL (ref 1.6–2.3)

## 2020-12-30 LAB — VITAMIN D 25 HYDROXY (VIT D DEFICIENCY, FRACTURES): Vit D, 25-Hydroxy: 28 ng/mL — ABNORMAL LOW (ref 30.0–100.0)

## 2021-06-28 ENCOUNTER — Ambulatory Visit: Payer: Self-pay | Admitting: Family Medicine

## 2021-06-30 ENCOUNTER — Ambulatory Visit: Payer: Self-pay | Admitting: Nurse Practitioner

## 2021-07-06 ENCOUNTER — Ambulatory Visit (HOSPITAL_COMMUNITY)
Admission: RE | Admit: 2021-07-06 | Discharge: 2021-07-06 | Disposition: A | Discharge status: Home / Self Care | Payer: Self-pay | Source: Ambulatory Visit | Attending: Nurse Practitioner | Admitting: Nurse Practitioner

## 2021-07-06 ENCOUNTER — Other Ambulatory Visit: Payer: Self-pay | Admitting: *Deleted

## 2021-07-06 ENCOUNTER — Encounter: Payer: Self-pay | Admitting: Nurse Practitioner

## 2021-07-06 ENCOUNTER — Emergency Department (HOSPITAL_COMMUNITY)
Admission: EM | Admit: 2021-07-06 | Discharge: 2021-07-06 | Disposition: A | Discharge status: Home / Self Care | Payer: Self-pay | Attending: Nurse Practitioner | Admitting: Nurse Practitioner

## 2021-07-06 ENCOUNTER — Ambulatory Visit (HOSPITAL_COMMUNITY): Admission: RE | Admit: 2021-07-06 | Payer: Self-pay | Source: Ambulatory Visit

## 2021-07-06 ENCOUNTER — Other Ambulatory Visit: Payer: Self-pay

## 2021-07-06 ENCOUNTER — Encounter (HOSPITAL_COMMUNITY): Payer: Self-pay | Admitting: Emergency Medicine

## 2021-07-06 ENCOUNTER — Emergency Department (HOSPITAL_COMMUNITY)
Admit: 2021-07-06 | Discharge: 2021-07-06 | Disposition: A | Discharge status: Home / Self Care | Payer: Self-pay | Attending: Nurse Practitioner | Admitting: Nurse Practitioner

## 2021-07-06 ENCOUNTER — Ambulatory Visit (INDEPENDENT_AMBULATORY_CARE_PROVIDER_SITE_OTHER): Payer: Self-pay | Admitting: Nurse Practitioner

## 2021-07-06 VITALS — BP 116/60 | HR 72 | Temp 98.1°F | Ht 67.0 in | Wt 218.2 lb

## 2021-07-06 DIAGNOSIS — Z5321 Procedure and treatment not carried out due to patient leaving prior to being seen by health care provider: Secondary | ICD-10-CM | POA: Insufficient documentation

## 2021-07-06 DIAGNOSIS — N5082 Scrotal pain: Secondary | ICD-10-CM

## 2021-07-06 DIAGNOSIS — R300 Dysuria: Secondary | ICD-10-CM

## 2021-07-06 DIAGNOSIS — N50812 Left testicular pain: Secondary | ICD-10-CM

## 2021-07-06 DIAGNOSIS — R1031 Right lower quadrant pain: Secondary | ICD-10-CM | POA: Insufficient documentation

## 2021-07-06 DIAGNOSIS — N5089 Other specified disorders of the male genital organs: Secondary | ICD-10-CM | POA: Insufficient documentation

## 2021-07-06 DIAGNOSIS — R1012 Left upper quadrant pain: Secondary | ICD-10-CM | POA: Insufficient documentation

## 2021-07-06 LAB — POCT URINALYSIS DIP (CLINITEK)
Bilirubin, UA: NEGATIVE
Blood, UA: NEGATIVE
Glucose, UA: NEGATIVE mg/dL
Ketones, POC UA: NEGATIVE mg/dL
Leukocytes, UA: NEGATIVE
Nitrite, UA: NEGATIVE
POC PROTEIN,UA: NEGATIVE
Spec Grav, UA: 1.02 (ref 1.010–1.025)
Urobilinogen, UA: 1 E.U./dL
pH, UA: 7 (ref 5.0–8.0)

## 2021-07-06 NOTE — ED Provider Notes (Signed)
Emergency Medicine Provider Triage Evaluation Note  Keith Price , a 40 y.o. male  was evaluated in triage.  Pt complains of right upper quadrant pain along with left testicular swelling that is been ongoing for some time now.  Evaluated by PCP today and sent for an emergent ultrasound along with CT imaging.  Patient attempted to be seen at the outpatient ultrasound place, however was told that entrance was closed and referred to the ED.  He has been waiting here for the past hour and a half.  Review of Systems  Positive: Upper quadrant pain, testicular swelling Negative: Nausea, vomiting, dysuria  Physical Exam  There were no vitals taken for this visit. Gen:   Awake, no distress   Resp:  Normal effort  MSK:   Moves extremities without difficulty  Other:    Medical Decision Making  Medically screening exam initiated at 5:50 PM.  Appropriate orders placed.  [name] was informed that the remainder of the evaluation will be completed by another provider, this initial triage assessment does not replace that evaluation, and the importance of remaining in the ED until their evaluation is complete.  5:55 PM I personally called ultrasound department who were aware that patient was in the ED, and will be coming to get patient for exam. According to her notes it sounds like this is an emergent study, he looks otherwise well and hemodynamically stable   Janeece Fitting, PA-C 07/06/21 [place]    Lajean Saver, MD 07/06/21 [id number]

## 2021-07-06 NOTE — ED Triage Notes (Signed)
Pt here for an [place] of his scrotum , hx of testicle ca

## 2021-07-06 NOTE — ED Notes (Signed)
No answer for VS x3

## 2021-07-06 NOTE — Patient Instructions (Signed)
You were seen in the patient care center today for your abdominal pain and testicular pain.  A urinalysis was collected, which was overall unremarkable.  The cause of your symptoms is currently unknown, pending imaging and other lab results.  Please continue taking over-the-counter medications as needed for pain.  You will be contacted by phone regarding your study results and  follow-up appointment.

## 2021-07-06 NOTE — Progress Notes (Signed)
[address], [place]  [address] Phone:  [phone]   Fax:  [phone] Subjective:   Patient ID: Keith Price, male    DOB: [birth year], 40 y.o.   MRN: [id number]  Chief Complaint  Patient presents with   Follow-up    Pt is here today for his regular follow up visit. Pt states he has been having lower right abdominal pains that radiate to his lower right side x 2 weeks. Pt states the pain feels worse when he is having a bowel movement.   HPI [name] 40 y.o. male presents with history of abdominal mass, genital infection and testicular cancer to the [facility] with abdominal and scrotal pain x 2 wks. States that abdominal pain located in the right lower quadrant.  Rates pain a 6-7 out of 10 and describes it as pinching, needles, squishing pain.  Pain increases after eating and having bowel movement.  Denies any nausea/vomiting/diarrhea.  Denies any fever.  Denies any chest pain or shortness of breath.  Denies having similar symptoms in the past.  Has been taking Tylenol ibuprofen for pain, with mild improvement.  Pain occurs intermittently.  Last bowel movement this morning.  Denies any dark stools or blood in stool.  Endorses having some burning with urination, 2-3 times per week.  Has some dribbling with urination and increased urgency.  Also endorses having pain in the scrotum with heavy lifting and after sex x2 to 3 months.  Denies any penile discharge or lesions.  Endorses only having 1 partner in the past 6 months, married.  Denies any recent trauma or injury.  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   Past Medical History:  Diagnosis Date   Ankle fracture, right 07/02/2018   Coronavirus infection 2021   Fever blister    History of chemotherapy 2011   History of testicular cancer 2011   Iron deficiency anemia     Past Surgical  History:  Procedure Laterality Date   CYSTOSCOPY     CYSTOSCOPY W/ URETERAL STENT PLACEMENT Right 03/16/2010   ORCHIECTOMY Right 03/16/2010   ORIF ANKLE FRACTURE Right 07/08/2018   Procedure: OPEN REDUCTION INTERNAL FIXATION (ORIF) ANKLE FRACTURE;  Surgeon: Hiram Gash, MD;  Location: [facility];  Service: Orthopedics;  Laterality: Right;   SYNDESMOSIS REPAIR Right 07/08/2018   Procedure: SYNDESMOSIS REPAIR RIGHT ANKLE;  Surgeon: Hiram Gash, MD;  Location: [facility];  Service: Orthopedics;  Laterality: Right;    Family History  Problem Relation Age of Onset   Diabetes Father    Diabetes Sister    Diabetes Brother     Social History   Socioeconomic History   Marital status: Married    Spouse name: Not on file   Number of children: 3   Years of education: Not on file   Highest education level: Not on file  Occupational History   Not on file  Tobacco Use   Smoking status: Never   Smokeless tobacco: Never  Vaping Use   Vaping Use: Never used  Substance and Sexual Activity   Alcohol use: No   Drug use: No   Sexual activity: Yes  Other Topics Concern   Not on file  Social History Narrative   Not on file   Social Determinants of Health   Financial Resource Strain: Not on file  Food Insecurity: Not on  file  Transportation Needs: Not on file  Physical Activity: Not on file  Stress: Not on file  Social Connections: Not on file  Intimate Partner Violence: Not on file    Outpatient Medications Prior to Visit  Medication Sig Dispense Refill   albuterol (VENTOLIN HFA) 108 (90 Base) MCG/ACT inhaler Inhale 2 puffs into the lungs every 6 (six) hours as needed for wheezing or shortness of breath. 8 g 3   b complex vitamins tablet Take 1 tablet by mouth daily.     cholecalciferol (VITAMIN D) 1000 units tablet Take by mouth daily.     cyclobenzaprine (FLEXERIL) 10 MG tablet Take 1 tablet (10 mg total) by mouth 3 (three) times daily as needed for  muscle spasms. 30 tablet 3   glucosamine-chondroitin 500-400 MG tablet Take 2 tablets by mouth 2 (two) times daily.     traZODone (DESYREL) 50 MG tablet Take 0.5-1 tablets (25-50 mg total) by mouth at bedtime as needed for sleep. 30 tablet 2   vitamin C (ASCORBIC ACID) 500 MG tablet Take 500 mg by mouth daily.     No facility-administered medications prior to visit.    No Known Allergies  Review of Systems  Constitutional:  Negative for chills, fever and malaise/fatigue.  Respiratory:  Negative for cough and shortness of breath.   Cardiovascular:  Negative for chest pain, palpitations and leg swelling.  Gastrointestinal:  Positive for abdominal pain. Negative for blood in stool, constipation, diarrhea, nausea and vomiting.       RLQ  Genitourinary:  Positive for dysuria, frequency and urgency. Negative for hematuria.       Scrotal pain   Skin: Negative.   Psychiatric/Behavioral:  Negative for depression. The patient is not nervous/anxious.   All other systems reviewed and are negative.     Objective:    Physical Exam Vitals reviewed. Exam conducted with a chaperone present.  Constitutional:      General: He is not in acute distress.    Appearance: Normal appearance.  HENT:     Head: Normocephalic.  Cardiovascular:     Rate and Rhythm: Normal rate and regular rhythm.     Pulses: Normal pulses.     Heart sounds: Normal heart sounds.     Comments: No obvious peripheral edema Pulmonary:     Effort: Pulmonary effort is normal.     Breath sounds: Normal breath sounds.  Abdominal:     General: Abdomen is flat. Bowel sounds are normal. There is no distension.     Palpations: Abdomen is soft. There is no mass.     Tenderness: There is no abdominal tenderness. There is no right CVA tenderness or left CVA tenderness.     Hernia: A hernia is present. There is no hernia in the left inguinal area or right inguinal area.  Genitourinary:    Penis: Normal. No tenderness, discharge,  swelling or lesions.      Testes:        Left: Mass, tenderness or swelling not present.     Comments: Right testicle absent Lymphadenopathy:     Lower Body: No right inguinal adenopathy. No left inguinal adenopathy.  Skin:    General: Skin is warm and dry.     Capillary Refill: Capillary refill takes less than 2 seconds.  Neurological:     Mental Status: He is alert.  Psychiatric:        Mood and Affect: Mood normal.        Behavior: Behavior normal.  Thought Content: Thought content normal.        Judgment: Judgment normal.    BP 116/60   Pulse 72   Temp 98.1 F (36.7 C)   Wt 218 lb 3.2 oz (99 kg)   SpO2 95%   BMI 34.17 kg/m  Wt Readings from Last 3 Encounters:  07/06/21 218 lb 3.2 oz (99 kg)  12/29/20 214 lb (97.1 kg)  06/29/20 212 lb (96.2 kg)    Immunization History  Administered Date(s) Administered   Influenza,inj,Quad PF,6+ Mos 08/30/2016, 08/14/2017, 12/29/2020   PFIZER(Purple Top)SARS-COV-2 Vaccination 01/09/2019, 03/19/2019   Tdap 08/30/2016    Diabetic Foot Exam - Simple   No data filed     Lab Results  Component Value Date   TSH 2.770 12/29/2020   Lab Results  Component Value Date   WBC 5.6 12/29/2020   HGB 16.7 12/29/2020   HCT 49.3 12/29/2020   MCV 87 12/29/2020   PLT 188 12/29/2020   Lab Results  Component Value Date   NA 142 12/29/2020   K 4.5 12/29/2020   CO2 22 12/29/2020   GLUCOSE 103 (H) 12/29/2020   BUN 13 12/29/2020   CREATININE 0.97 12/29/2020   BILITOT 0.5 12/29/2020   ALKPHOS 122 (H) 12/29/2020   AST 21 12/29/2020   ALT 27 12/29/2020   PROT 7.4 12/29/2020   ALBUMIN 4.3 12/29/2020   CALCIUM 8.9 12/29/2020   Lab Results  Component Value Date   CHOL 165 12/29/2020   CHOL 136 06/17/2019   CHOL 147 08/30/2016   Lab Results  Component Value Date   HDL 43 12/29/2020   HDL 41 06/17/2019   HDL 38 (L) 08/30/2016   Lab Results  Component Value Date   LDLCALC 108 (H) 12/29/2020   LDLCALC 68 06/17/2019    LDLCALC 85 08/30/2016   Lab Results  Component Value Date   TRIG 74 12/29/2020   TRIG 137 06/17/2019   TRIG 118 08/30/2016   Lab Results  Component Value Date   CHOLHDL 3.8 12/29/2020   CHOLHDL 3.3 06/17/2019   CHOLHDL 3.9 08/30/2016   Lab Results  Component Value Date   HGBA1C 5.3 12/31/2019   HGBA1C 5.5 06/17/2019   HGBA1C 5.2 11/29/2018       Assessment & Plan:  Based on history and physical, currently concerning for BPH versus genitourinary infection versus STD versus recurrence of malignancy versus other intra-abdominal etiology  Plan to order labs and studies as described below.  We will review results and treat accordingly.  Patient may require referral to urology versus oncology.  Patient informed to continue taking Tylenol and/or ibuprofen as needed for pain while awaiting results.  Patient demonstrate understanding agree with treatment plan.  Follow-up to be scheduled within the next week, pending study results.  Problem List Items Addressed This Visit   None Visit Diagnoses     Right lower quadrant abdominal pain    -  Primary   Relevant Orders   POCT URINALYSIS DIP (CLINITEK) (Completed)   CBC with Differential   Comprehensive metabolic panel   Lipase   US SCROTUM DOPPLER   CT Abdomen Pelvis Wo Contrast   Dysuria       Relevant Orders   PSA   GC/Chlamydia Probe Amp([facility])   Pain in left testicle       Scrotal pain       Relevant Orders   GC/Chlamydia Probe Amp([facility])       I am having Shannan [name] maintain his traZODone, b  complex vitamins, vitamin C, cholecalciferol, glucosamine-chondroitin, albuterol, and cyclobenzaprine.  No orders of the defined types were placed in this encounter.    Teena Dunk, NP

## 2021-07-07 LAB — COMPREHENSIVE METABOLIC PANEL
ALT: 36 IU/L (ref 0–44)
AST: 20 IU/L (ref 0–40)
Albumin/Globulin Ratio: 1.6 (ref 1.2–2.2)
Albumin: 4.3 g/dL (ref 4.0–5.0)
Alkaline Phosphatase: 113 IU/L (ref 44–121)
BUN/Creatinine Ratio: 20 (ref 9–20)
BUN: 19 mg/dL (ref 6–24)
Bilirubin Total: 0.4 mg/dL (ref 0.0–1.2)
CO2: 22 mmol/L (ref 20–29)
Calcium: 9 mg/dL (ref 8.7–10.2)
Chloride: 104 mmol/L (ref 96–106)
Creatinine, Ser: 0.97 mg/dL (ref 0.76–1.27)
Globulin, Total: 2.7 g/dL (ref 1.5–4.5)
Glucose: 84 mg/dL (ref 65–99)
Potassium: 4 mmol/L (ref 3.5–5.2)
Sodium: 139 mmol/L (ref 134–144)
Total Protein: 7 g/dL (ref 6.0–8.5)
eGFR: 101 mL/min/{1.73_m2} (ref >59)

## 2021-07-07 LAB — CBC WITH DIFFERENTIAL/PLATELET
Basophils Absolute: 0.1 10*3/uL (ref 0.0–0.2)
Basos: 1 %
EOS (ABSOLUTE): 0.1 10*3/uL (ref 0.0–0.4)
Eos: 2 %
Hematocrit: 48.6 % (ref 37.5–51.0)
Hemoglobin: 16.3 g/dL (ref 13.0–17.7)
Immature Grans (Abs): 0 10*3/uL (ref 0.0–0.1)
Immature Granulocytes: 1 %
Lymphocytes Absolute: 1.8 10*3/uL (ref 0.7–3.1)
Lymphs: 27 %
MCH: 28.8 pg (ref 26.6–33.0)
MCHC: 33.5 g/dL (ref 31.5–35.7)
MCV: 86 fL (ref 79–97)
Monocytes Absolute: 0.7 10*3/uL (ref 0.1–0.9)
Monocytes: 10 %
Neutrophils Absolute: 4.2 10*3/uL (ref 1.4–7.0)
Neutrophils: 59 %
Platelets: 219 10*3/uL (ref 150–450)
RBC: 5.66 x10E6/uL (ref 4.14–5.80)
RDW: 13.1 % (ref 11.6–15.4)
WBC: 6.8 10*3/uL (ref 3.4–10.8)

## 2021-07-07 LAB — LIPASE: Lipase: 36 U/L (ref 13–78)

## 2021-07-07 LAB — PSA: Prostate Specific Ag, Serum: 0.4 ng/mL (ref 0.0–4.0)

## 2021-07-08 ENCOUNTER — Other Ambulatory Visit: Payer: Self-pay | Admitting: Nurse Practitioner

## 2021-07-08 DIAGNOSIS — R0602 Shortness of breath: Secondary | ICD-10-CM

## 2021-07-08 DIAGNOSIS — N50812 Left testicular pain: Secondary | ICD-10-CM

## 2021-07-08 DIAGNOSIS — Z8616 Personal history of COVID-19: Secondary | ICD-10-CM

## 2021-07-08 MED ORDER — ALBUTEROL SULFATE HFA 108 (90 BASE) MCG/ACT IN AERS: 2.0000 | INHALATION_SPRAY | Freq: Four times a day (QID) | RESPIRATORY_TRACT | 3 refills | Status: AC | PRN

## 2021-07-10 LAB — GC/CHLAMYDIA PROBE AMP
Chlamydia trachomatis, NAA: NEGATIVE
Neisseria Gonorrhoeae by PCR: NEGATIVE

## 2021-07-11 ENCOUNTER — Telehealth: Payer: Self-pay | Admitting: Oncology

## 2021-07-11 NOTE — Telephone Encounter (Signed)
Called pt - no answer - called spouse and a male picked up , answer the interpreter and hung up on her as she was introducing herself. - appt was made but has been cancelled.

## 2021-07-12 ENCOUNTER — Inpatient Hospital Stay: Payer: Self-pay | Admitting: Nurse Practitioner

## 2021-07-12 ENCOUNTER — Other Ambulatory Visit: Payer: Self-pay | Admitting: Nurse Practitioner

## 2021-07-12 DIAGNOSIS — N5082 Scrotal pain: Secondary | ICD-10-CM
